# Patient Record
Sex: Female | Born: 1995
Health system: Southern US, Community
[De-identification: ages and names within clinical notes are randomized; demographics above are authoritative.]

## PROBLEM LIST (undated history)

## (undated) DIAGNOSIS — E7211 Homocystinuria: Secondary | ICD-10-CM

## (undated) DIAGNOSIS — F329 Major depressive disorder, single episode, unspecified: Secondary | ICD-10-CM

## (undated) DIAGNOSIS — M5136 Other intervertebral disc degeneration, lumbar region: Secondary | ICD-10-CM

## (undated) DIAGNOSIS — F988 Other specified behavioral and emotional disorders with onset usually occurring in childhood and adolescence: Secondary | ICD-10-CM

## (undated) DIAGNOSIS — F419 Anxiety disorder, unspecified: Secondary | ICD-10-CM

## (undated) DIAGNOSIS — M51369 Other intervertebral disc degeneration, lumbar region without mention of lumbar back pain or lower extremity pain: Secondary | ICD-10-CM

## (undated) DIAGNOSIS — F32A Depression, unspecified: Secondary | ICD-10-CM

## (undated) DIAGNOSIS — H93293 Other abnormal auditory perceptions, bilateral: Secondary | ICD-10-CM

## (undated) DIAGNOSIS — H93299 Other abnormal auditory perceptions, unspecified ear: Secondary | ICD-10-CM

## (undated) HISTORY — PX: OTHER SURGICAL HISTORY: SHX169

## (undated) HISTORY — DX: Depression, unspecified: F32.A

## (undated) HISTORY — DX: Other intervertebral disc degeneration, lumbar region without mention of lumbar back pain or lower extremity pain: M51.369

## (undated) HISTORY — DX: Other abnormal auditory perceptions, bilateral: H93.293

## (undated) HISTORY — DX: Major depressive disorder, single episode, unspecified: F32.9

## (undated) HISTORY — DX: Anxiety disorder, unspecified: F41.9

## (undated) HISTORY — DX: Other intervertebral disc degeneration, lumbar region: M51.36

## (undated) HISTORY — DX: Other abnormal auditory perceptions, unspecified ear: H93.299

## (undated) HISTORY — PX: TYMPANOSTOMY TUBE PLACEMENT: SHX32

---
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
## 2016-01-08 MED FILL — ELFOLATE 15 MG TAB: 15 | 30 days supply | Qty: 30 | Fill #0

## 2016-02-11 DIAGNOSIS — D2239 Melanocytic nevi of other parts of face: Secondary | ICD-10-CM | POA: Diagnosis not present

## 2016-02-11 DIAGNOSIS — L858 Other specified epidermal thickening: Secondary | ICD-10-CM | POA: Diagnosis not present

## 2016-02-11 DIAGNOSIS — R21 Rash and other nonspecific skin eruption: Secondary | ICD-10-CM | POA: Diagnosis not present

## 2016-02-17 DIAGNOSIS — Z01419 Encounter for gynecological examination (general) (routine) without abnormal findings: Secondary | ICD-10-CM | POA: Diagnosis not present

## 2016-02-17 DIAGNOSIS — Z6824 Body mass index (BMI) 24.0-24.9, adult: Secondary | ICD-10-CM | POA: Diagnosis not present

## 2016-02-17 DIAGNOSIS — N760 Acute vaginitis: Secondary | ICD-10-CM | POA: Diagnosis not present

## 2016-02-26 ENCOUNTER — Telehealth: Payer: Self-pay

## 2016-02-26 ENCOUNTER — Encounter: Payer: Self-pay | Admitting: Family

## 2016-02-26 ENCOUNTER — Ambulatory Visit (INDEPENDENT_AMBULATORY_CARE_PROVIDER_SITE_OTHER): Payer: 59 | Admitting: Family

## 2016-02-26 VITALS — BP 110/60 | HR 96 | Temp 99.0°F | Ht 60.02 in | Wt 125.0 lb

## 2016-02-26 DIAGNOSIS — H00013 Hordeolum externum right eye, unspecified eyelid: Secondary | ICD-10-CM

## 2016-02-26 NOTE — Telephone Encounter (Signed)
Please advise 

## 2016-02-26 NOTE — Patient Instructions (Signed)
warm compresses, placed off and on for about 15 minutes at a time approximately four times per day.  If, despite continued treatment with warm compresses, the hordeolum hardens into a chalazion and does not reduce in size within one to two weeks, we will  Refer to an ophthalmologist for consideration of incision and curettage.  If there is no improvement in your symptoms, or if there is any worsening of symptoms, or if you have any additional concerns, please return for re-evaluation; or, if we are closed, consider going to the Emergency Room for evaluation if symptoms urgent.  Stye A stye is a bump on your eyelid caused by a bacterial infection. A stye can form inside the eyelid (internal stye) or outside the eyelid (external stye). An internal stye may be caused by an infected oil-producing gland inside your eyelid. An external stye may be caused by an infection at the base of your eyelash (hair follicle). Styes are very common. Anyone can get them at any age. They usually occur in just one eye, but you may have more than one in either eye.  CAUSES  The infection is almost always caused by bacteria called Staphylococcus aureus. This is a common type of bacteria that lives on your skin. RISK FACTORS You may be at higher risk for a stye if you have had one before. You may also be at higher risk if you have:  Diabetes.  Long-term illness.  Long-term eye redness.  A skin condition called seborrhea.  High fat levels in your blood (lipids). SIGNS AND SYMPTOMS  Eyelid pain is the most common symptom of a stye. Internal styes are more painful than external styes. Other signs and symptoms may include:  Painful swelling of your eyelid.  A scratchy feeling in your eye.  Tearing and redness of your eye.  Pus draining from the stye. DIAGNOSIS  Your health care provider may be able to diagnose a stye just by examining your eye. The health care provider may also check to make sure:  You do not  have a fever or other signs of a more serious infection.  The infection has not spread to other parts of your eye or areas around your eye. TREATMENT  Most styes will clear up in a few days without treatment. In some cases, you may need to use antibiotic drops or ointment to prevent infection. Your health care provider may have to drain the stye surgically if your stye is:  Large.  Causing a lot of pain.  Interfering with your vision. This can be done using a thin blade or a needle.  HOME CARE INSTRUCTIONS   Take medicines only as directed by your health care provider.  Apply a clean, warm compress to your eye for 10 minutes, 4 times a day.  Do not wear contact lenses or eye makeup until your stye has healed.  Do not try to pop or drain the stye. SEEK MEDICAL CARE IF:  You have chills or a fever.  Your stye does not go away after several days.  Your stye affects your vision.  Your eyeball becomes swollen, red, or painful. MAKE SURE YOU:  Understand these instructions.  Will watch your condition.  Will get help right away if you are not doing well or get worse.   This information is not intended to replace advice given to you by your health care provider. Make sure you discuss any questions you have with your health care provider.   Document Released:  05/26/2005 Document Revised: 09/06/2014 Document Reviewed: 11/30/2013 Elsevier Interactive Patient Education [employer].

## 2016-02-26 NOTE — Telephone Encounter (Signed)
Pt is going to [facility] and is requesting a prescription for prednisone and cipro before she leaves. Leaving around 03/13/2016.

## 2016-02-26 NOTE — Progress Notes (Signed)
Pre visit review using our clinic review tool, if applicable. No additional management support is needed unless otherwise documented below in the visit note. 

## 2016-02-26 NOTE — Progress Notes (Signed)
Subjective:    Patient ID: Julie Anderson, female    DOB: March 01, 1996, 20 y.o.   MRN: [id number]   Julie Anderson is a 20 y.o. female who presents today for an acute visit.    HPI Comments: Patient here for evaluation right eyelid swelling which started yesterday. No changes in vision, eye discharge, fever, chills.   Past Medical History  Diagnosis Date  . ADD (attention deficit disorder)   . Anxiety   . Depression    Allergies: Review of patient's allergies indicates no known allergies. Current Outpatient Prescriptions on File Prior to Visit  Medication Sig Dispense Refill  . albuterol (PROVENTIL HFA;VENTOLIN HFA) 108 (90 BASE) MCG/ACT inhaler Inhale 2 puffs into the lungs every 4 (four) hours as needed for wheezing or shortness of breath. 8 g 5  . DAYTRANA 20 MG/9HR Place 1 patch onto the skin daily.   0  . FLUoxetine (PROZAC) 20 MG capsule Take 20 mg by mouth daily.    . fluticasone (FLONASE) 50 MCG/ACT nasal spray Place 2 sprays into both nostrils daily. 16 g 5  . folic acid (FOLVITE) 1 MG tablet Take 1 mg by mouth daily.    . Multiple Vitamins-Minerals (MULTIVITAMIN WITH MINERALS) tablet Take 1 tablet by mouth daily.     No current facility-administered medications on file prior to visit.    Social History  Substance Use Topics  . Smoking status: Never Smoker   . Smokeless tobacco: Never Used  . Alcohol Use: No    Review of Systems  Constitutional: Negative for fever and chills.  Respiratory: Negative for cough.   Cardiovascular: Negative for chest pain and palpitations.  Gastrointestinal: Negative for nausea and vomiting.      Objective:    BP 110/60 mmHg  Pulse 96  Temp(Src) 99 F (37.2 C) (Oral)  Ht 5' 0.02" (1.525 m)  Wt 125 lb (56.7 kg)  BMI 24.38 kg/m2  SpO2 98%   Physical Exam  Constitutional: She appears well-developed and well-nourished.  HENT:  Head: Normocephalic and atraumatic.  Right Ear: Hearing, tympanic membrane, external ear and ear canal  normal. No drainage, swelling or tenderness. No foreign bodies. Tympanic membrane is not erythematous and not bulging. No middle ear effusion. No decreased hearing is noted.  Left Ear: Hearing, tympanic membrane, external ear and ear canal normal. No drainage, swelling or tenderness. No foreign bodies. Tympanic membrane is not erythematous and not bulging.  No middle ear effusion. No decreased hearing is noted.  Nose: Nose normal. No rhinorrhea. Right sinus exhibits no maxillary sinus tenderness and no frontal sinus tenderness. Left sinus exhibits no maxillary sinus tenderness and no frontal sinus tenderness.  Mouth/Throat: Uvula is midline, oropharynx is clear and moist and mucous membranes are normal. No oropharyngeal exudate, posterior oropharyngeal edema, posterior oropharyngeal erythema or tonsillar abscesses.  Eyes: Conjunctivae and EOM are normal. Pupils are equal, round, and reactive to light. Lids are everted and swept, no foreign bodies found. Right eye exhibits hordeolum. Right eye exhibits no discharge and no exudate. No foreign body present in the right eye. Left eye exhibits no discharge, no exudate and no hordeolum. No foreign body present in the left eye. Right conjunctiva is not injected. Right conjunctiva has no hemorrhage. Left conjunctiva is not injected. Left conjunctiva has no hemorrhage. No scleral icterus.  Surrounding skin intact.   Right eye:  Right mid upper lid erythematous, tender nodule. No drainage. Approx 1 cm wide.   No injection of the conjunctiva. No  white spots, opacity, or foreign body appreciated. No collection of blood or pus in the anterior chamber. No ciliary flush surrounding iris.   No photophobia or eye pain appreciated during exam.   Cardiovascular: Regular rhythm, normal heart sounds and normal pulses.   Pulmonary/Chest: Effort normal and breath sounds normal. She has no wheezes. She has no rhonchi. She has no rales.  Lymphadenopathy:       Head (right  side): No submental, no submandibular, no tonsillar, no preauricular, no posterior auricular and no occipital adenopathy present.       Head (left side): No submental, no submandibular, no tonsillar, no preauricular, no posterior auricular and no occipital adenopathy present.    She has no cervical adenopathy.  Neurological: She is alert.  Skin: Skin is warm and dry.  Psychiatric: She has a normal mood and affect. Her speech is normal and behavior is normal. Thought content normal.  Vitals reviewed.      Assessment & Plan:    1. Stye external, right Warm compresses. No evidence of bacterial infection. Advised patient if does not improve in 1 week with warm compresses, I will place a referral to ophthalmology for possible I&D.   I am having Julie Anderson maintain her multivitamin with minerals, folic acid, DAYTRANA, fluticasone, albuterol, and FLUoxetine.   No orders of the defined types were placed in this encounter.     Start medications as prescribed and explained to patient on After Visit Summary ( AVS). Risks, benefits, and alternatives of the medications and treatment plan prescribed today were discussed, and patient expressed understanding.   Education regarding symptom management and diagnosis given to patient.   Follow-up:Plan follow-up and return precautions given if any worsening symptoms or change in condition.   Continue to follow with Binnie Rail, MD for routine health maintenance.   Julie Anderson and I agreed with plan.   Mable Paris, FNP

## 2016-02-27 DIAGNOSIS — L738 Other specified follicular disorders: Secondary | ICD-10-CM | POA: Diagnosis not present

## 2016-02-27 NOTE — Telephone Encounter (Signed)
LVM for pts mother to call back.

## 2016-02-27 NOTE — Telephone Encounter (Signed)
I just saw her sister and mom for the same.  She does not need to come in.  I will give the cipro.  What is the prednisone for?

## 2016-03-01 DIAGNOSIS — F411 Generalized anxiety disorder: Secondary | ICD-10-CM | POA: Diagnosis not present

## 2016-03-04 MED ORDER — CIPROFLOXACIN HCL 500 MG PO TABS: 500.0000 mg | ORAL_TABLET | Freq: Two times a day (BID) | ORAL | Status: DC

## 2016-03-04 MED ORDER — AZITHROMYCIN 250 MG PO TABS: ORAL_TABLET | ORAL | Status: DC

## 2016-03-04 MED FILL — CIPROFLOXACIN HCL 500 MG TA: 500 | 3 days supply | Qty: 6 | Fill #0

## 2016-03-04 MED FILL — AZITHROMYCIN 250 MG TABLET: 250 | 5 days supply | Qty: 6 | Fill #0

## 2016-03-04 NOTE — Telephone Encounter (Signed)
Spoke with pts mother. She states that pt needs Z-pac and Cipro, like given to her sister and mother. Please send to [facility].

## 2016-03-04 NOTE — Telephone Encounter (Signed)
Prescriptions sent to [facility]

## 2016-03-05 MED FILL — FLUoxetine HCL 20 MG CAPS: 20 | 30 days supply | Qty: 30 | Fill #0 | Status: TO

## 2016-03-08 MED FILL — DAYTRANA 20 MG/9 HOUR PATCH: 20 | 30 days supply | Qty: 30 | Fill #0

## 2016-03-08 MED FILL — ELFOLATE 15 MG TAB: 15 | 30 days supply | Qty: 30 | Fill #0 | Status: TO

## 2016-04-02 MED FILL — FLUoxetine HCL 20 MG CAPS: 20 | 30 days supply | Qty: 30 | Fill #0

## 2016-04-02 MED FILL — ELFOLATE 15 MG TAB: 15 | 30 days supply | Qty: 30 | Fill #0 | Status: TO

## 2016-04-05 ENCOUNTER — Telehealth: Payer: Self-pay | Admitting: Internal Medicine

## 2016-04-05 NOTE — Telephone Encounter (Signed)
PLEASE NOTE: All timestamps contained within this report are represented as [place] Standard Time. CONFIDENTIALTY NOTICE: This fax transmission is intended only for the addressee. It contains information that is legally privileged, confidential or otherwise protected from use or disclosure. If you are not the intended recipient, you are strictly prohibited from reviewing, disclosing, copying using or disseminating any of this information or taking any action in reliance on or regarding this information. If you have received this fax in error, please notify us immediately by telephone so that we can arrange for its return to [place]. Phone: [phone], Toll-Free: [phone], Fax: [phone] Page: 1 of 1 Call Id: [id number] [place] Patient Name: Julie Anderson DOB: [birth year] Initial Comment chart 2/3 Caller says, family came back from [place] last week, father Dx with E-coli infection and in hospital. Mother wants to get dtr tested for this as well. No Sx now. Nurse Assessment Nurse: Dimas Chyle, RN, Dellis Filbert Date/Time Eilene Ghazi Time): 04/05/2016 9:34:56 AM Confirm and document reason for call. If symptomatic, describe symptoms. You must click the next button to save text entered. ---Caller says, family came back from [place] last week, father Dx with E-coli infection and in hospital. Mother wants to get dtr tested for this as well. No Sx now. Has the patient traveled out of the country within the last 30 days? ---Yes Where have you traveled? ([place] for [place] and Ebola guideline, [place], [place] for [place]) ---[place] Does the patient have any new or worsening symptoms? ---No Please document clinical information provided and list any resource used. ---Advised caller that I could schedule appointment for daughter for blood work due to possible exposure to E-coli, Guidelines Guideline Title  Affirmed Question Affirmed Notes Final Disposition User Clinical Call Pierz, Therapist, sports, [employer]

## 2016-04-07 ENCOUNTER — Ambulatory Visit: Payer: Self-pay | Admitting: Internal Medicine

## 2016-05-05 DIAGNOSIS — F411 Generalized anxiety disorder: Secondary | ICD-10-CM | POA: Diagnosis not present

## 2016-05-07 MED FILL — TRINTELLIX 10 MG TABLET: 10 | 30 days supply | Qty: 30 | Fill #0 | Status: TO

## 2016-05-31 ENCOUNTER — Ambulatory Visit (INDEPENDENT_AMBULATORY_CARE_PROVIDER_SITE_OTHER): Payer: 59

## 2016-05-31 DIAGNOSIS — Z23 Encounter for immunization: Secondary | ICD-10-CM

## 2016-05-31 MED FILL — ELFOLATE 15 MG TAB: 15 | 30 days supply | Qty: 30 | Fill #0

## 2016-05-31 MED FILL — TRINTELLIX 10 MG TABLET: 10 | 30 days supply | Qty: 30 | Fill #0

## 2016-06-02 DIAGNOSIS — Z5181 Encounter for therapeutic drug level monitoring: Secondary | ICD-10-CM | POA: Diagnosis not present

## 2016-06-02 DIAGNOSIS — D559 Anemia due to enzyme disorder, unspecified: Secondary | ICD-10-CM | POA: Diagnosis not present

## 2016-06-16 DIAGNOSIS — F411 Generalized anxiety disorder: Secondary | ICD-10-CM | POA: Diagnosis not present

## 2016-06-16 MED FILL — VIT D2 1.25 MG (50,000 UNIT: 1.25 MG | 28 days supply | Qty: 4 | Fill #0

## 2016-06-29 DIAGNOSIS — H93299 Other abnormal auditory perceptions, unspecified ear: Secondary | ICD-10-CM | POA: Diagnosis not present

## 2016-07-03 LAB — LIPID PANEL
Cholesterol: 270 mg/dL — AB (ref 0–200)
HDL: 52 mg/dL (ref 35–70)
LDL Cholesterol: 173 mg/dL
Triglycerides: 223 mg/dL — AB (ref 40–160)

## 2016-07-03 LAB — BASIC METABOLIC PANEL: Glucose: 116 mg/dL

## 2016-07-05 ENCOUNTER — Ambulatory Visit (INDEPENDENT_AMBULATORY_CARE_PROVIDER_SITE_OTHER): Payer: 59 | Admitting: Internal Medicine

## 2016-07-05 ENCOUNTER — Encounter: Payer: Self-pay | Admitting: Internal Medicine

## 2016-07-05 DIAGNOSIS — F32A Depression, unspecified: Secondary | ICD-10-CM

## 2016-07-05 DIAGNOSIS — R739 Hyperglycemia, unspecified: Secondary | ICD-10-CM | POA: Diagnosis not present

## 2016-07-05 DIAGNOSIS — E785 Hyperlipidemia, unspecified: Secondary | ICD-10-CM | POA: Insufficient documentation

## 2016-07-05 DIAGNOSIS — E559 Vitamin D deficiency, unspecified: Secondary | ICD-10-CM | POA: Diagnosis not present

## 2016-07-05 DIAGNOSIS — F329 Major depressive disorder, single episode, unspecified: Secondary | ICD-10-CM

## 2016-07-05 DIAGNOSIS — E7211 Homocystinuria: Secondary | ICD-10-CM

## 2016-07-05 DIAGNOSIS — E7800 Pure hypercholesterolemia, unspecified: Secondary | ICD-10-CM

## 2016-07-05 DIAGNOSIS — F418 Other specified anxiety disorders: Secondary | ICD-10-CM

## 2016-07-05 DIAGNOSIS — F419 Anxiety disorder, unspecified: Secondary | ICD-10-CM

## 2016-07-05 DIAGNOSIS — F988 Other specified behavioral and emotional disorders with onset usually occurring in childhood and adolescence: Secondary | ICD-10-CM

## 2016-07-05 DIAGNOSIS — H93239 Hyperacusis, unspecified ear: Secondary | ICD-10-CM | POA: Insufficient documentation

## 2016-07-05 MED ORDER — VORTIOXETINE HBR 10 MG PO TABS: ORAL_TABLET | ORAL | Status: DC

## 2016-07-05 NOTE — Progress Notes (Signed)
Subjective:    Patient ID: Julie Anderson, female    DOB: 02-18-1996, 20 y.o.   MRN: [id number]  HPI She is here for follow up.  She recently blood work done by her psychiatrist and is here for follow-up regarding some abnormal tests.  Vitamin D deficiency: Her vitamin D level is low. She is currently taking a high-dose vitamin D. She was told to take a daily vitamin D following high-dose, but is unsure how much.   Hyperglycemia:  She does her blood work fasting in her sugar was elevated. She is currently in college and admits that she does not eat healthy as diet. She has recently started to exercise, but prior to that is not exercising regularly. She has been swimming.  She drinks Gatorade, soda (moutain dew or dr pepper) - three times a week or water.  She does not eat breakfast.  She eats at the [employer]-- eats pasta for lunch.  Dinner is usually - [employer], Technical sales engineer, Retail banker.  Snacks: peach yogurt, popcorn, bagel bits.  No coffee or alcohol.    She started to swim laps 2/weeks.  She just started this two weeks ago.  No exercise prior to that.    She also was noted to have high cholesterol. She is adopted and her family history is unknown.  ADHD, anxiety, depression: She has been following with psychiatrist, but the psychiatrist retired. They're hoping that I will be able to prescribe her medications, which she has been stable. She denies any side effects.  Medications and allergies reviewed with patient and updated if appropriate.  Patient Active Problem List   Diagnosis Date Noted  . Lumbar degenerative disc disease 06/25/2015  . Myofascial pain syndrome 06/06/2015  . ADD (attention deficit disorder) 09/24/2013  . Anxiety and depression 07/12/2013  . History of ADHD 07/12/2013  . Auditory hallucination 07/12/2013  . Allergic rhinitis 07/12/2013    Current Outpatient Prescriptions on File Prior to Visit  Medication Sig Dispense Refill  . albuterol (PROVENTIL  HFA;VENTOLIN HFA) 108 (90 BASE) MCG/ACT inhaler Inhale 2 puffs into the lungs every 4 (four) hours as needed for wheezing or shortness of breath. 8 g 5  . DAYTRANA 20 MG/9HR Place 1 patch onto the skin daily.   0  . FLUoxetine (PROZAC) 20 MG capsule Take 20 mg by mouth daily.    . fluticasone (FLONASE) 50 MCG/ACT nasal spray Place 2 sprays into both nostrils daily. 16 g 5  . folic acid (FOLVITE) 1 MG tablet Take 1 mg by mouth daily.    . Multiple Vitamins-Minerals (MULTIVITAMIN WITH MINERALS) tablet Take 1 tablet by mouth daily.     No current facility-administered medications on file prior to visit.     Past Medical History:  Diagnosis Date  . ADD (attention deficit disorder)   . Anxiety   . Depression     Past Surgical History:  Procedure Laterality Date  . TYMPANOSTOMY TUBE PLACEMENT      Social History   Social History  . Marital status: Single    Spouse name: N/A  . Number of children: N/A  . Years of education: N/A   Social History Main Topics  . Smoking status: Never Smoker  . Smokeless tobacco: Never Used  . Alcohol use No  . Drug use: No  . Sexual activity: Not on file   Other Topics Concern  . 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 Adopted: Yes    Review of Systems  Constitutional: Negative for appetite change, fever and unexpected weight change.  Respiratory: Negative for cough, shortness of breath and wheezing.   Cardiovascular: Negative for chest pain, palpitations and leg swelling.  Musculoskeletal: Negative for arthralgias and myalgias.  Neurological: Negative for dizziness, light-headedness and headaches.       Objective:   Vitals:   07/05/16 1438  BP: 106/74  Pulse: 97  Resp: 16  Temp: 99.1 F (37.3 C)   Filed Weights   07/05/16 1438  Weight: 130 lb (59 kg)   Body mass index is 25.39 kg/m.   Physical Exam Constitutional: Appears  well-developed and well-nourished. No distress.  HENT:  Head: Normocephalic and atraumatic.  Neck: Neck supple. No tracheal deviation present. No thyromegaly present.  No cervical lymphadenopathy Cardiovascular: Normal rate, regular rhythm and normal heart sounds.   No murmur heard. No carotid bruit .  No edema Pulmonary/Chest: Effort normal and breath sounds normal. No respiratory distress. No has no wheezes. No rales.  Skin: Skin is warm and dry. Not diaphoretic.  Psychiatric: Normal mood and affect. Behavior is normal.        Assessment & Plan:   See Problem List for Assessment and Plan of chronic medical problems.

## 2016-07-05 NOTE — Progress Notes (Signed)
Pre visit review using our clinic review tool, if applicable. No additional management support is needed unless otherwise documented below in the visit note. 

## 2016-07-05 NOTE — Patient Instructions (Signed)
  Test(s) ordered today. Your results will be released to [place] (or called to you) after review, usually within 72hours after test completion. If any changes need to be made, you will be notified at that same time.    Medications reviewed and updated.  No changes recommended at this time.   Please followup depending on blood work results

## 2016-07-07 MED FILL — TRINTELLIX 20 MG TABLET: 20 | 30 days supply | Qty: 30 | Fill #0

## 2016-07-08 ENCOUNTER — Encounter: Payer: Self-pay | Admitting: Internal Medicine

## 2016-07-08 NOTE — Assessment & Plan Note (Signed)
Recent fasting glucose elevated Discussed healthy diet, decreased portions, regular exercise and weight loss Discussed risk of prediabetes/diabetes, which may partially be genetic Recheck CMP, A1c in 2-3 months

## 2016-07-08 NOTE — Assessment & Plan Note (Signed)
I will start prescribing her medication Stable, controlled Continue current medication

## 2016-07-08 NOTE — Assessment & Plan Note (Signed)
Currently taking high-dose vitamin D Advised her to take 1000-2000 units daily after that Check vitamin D level. In 2-3 months

## 2016-07-08 NOTE — Assessment & Plan Note (Signed)
Currently taking full of gas daily Will recheck homocysteine level

## 2016-07-08 NOTE — Assessment & Plan Note (Signed)
Stable, controlled I will start prescribing her medication Continue Daytrana

## 2016-07-08 NOTE — Assessment & Plan Note (Signed)
Discussed healthy diet, decreased portions, regular exercise and weight loss We will recheck lipid panel in 2- 3 months

## 2016-07-12 DIAGNOSIS — H93233 Hyperacusis, bilateral: Secondary | ICD-10-CM | POA: Diagnosis not present

## 2016-07-12 DIAGNOSIS — H93299 Other abnormal auditory perceptions, unspecified ear: Secondary | ICD-10-CM | POA: Diagnosis not present

## 2016-08-12 ENCOUNTER — Ambulatory Visit (INDEPENDENT_AMBULATORY_CARE_PROVIDER_SITE_OTHER): Payer: 59 | Admitting: Psychology

## 2016-08-12 DIAGNOSIS — F4323 Adjustment disorder with mixed anxiety and depressed mood: Secondary | ICD-10-CM

## 2016-09-06 MED FILL — ELFOLATE 15 MG TAB: 15 | 30 days supply | Qty: 30 | Fill #0

## 2016-09-10 ENCOUNTER — Other Ambulatory Visit (INDEPENDENT_AMBULATORY_CARE_PROVIDER_SITE_OTHER): Payer: 59

## 2016-09-10 DIAGNOSIS — E7800 Pure hypercholesterolemia, unspecified: Secondary | ICD-10-CM | POA: Diagnosis not present

## 2016-09-10 DIAGNOSIS — E7211 Homocystinuria: Secondary | ICD-10-CM | POA: Diagnosis not present

## 2016-09-10 DIAGNOSIS — E559 Vitamin D deficiency, unspecified: Secondary | ICD-10-CM | POA: Diagnosis not present

## 2016-09-10 DIAGNOSIS — R739 Hyperglycemia, unspecified: Secondary | ICD-10-CM

## 2016-09-10 LAB — COMPREHENSIVE METABOLIC PANEL
ALBUMIN: 4.4 g/dL (ref 3.5–5.2)
ALK PHOS: 85 U/L (ref 47–119)
ALT: 15 U/L (ref 0–35)
AST: 17 U/L (ref 0–37)
BUN: 17 mg/dL (ref 6–23)
CALCIUM: 9.6 mg/dL (ref 8.4–10.5)
CHLORIDE: 107 meq/L (ref 96–112)
CO2: 25 mEq/L (ref 19–32)
CREATININE: 0.76 mg/dL (ref 0.40–1.20)
GFR: 103.59 mL/min (ref >60.00)
Glucose, Bld: 106 mg/dL — ABNORMAL HIGH (ref 70–99)
POTASSIUM: 3.8 meq/L (ref 3.5–5.1)
Sodium: 140 mEq/L (ref 135–145)
Total Bilirubin: 0.4 mg/dL (ref 0.2–1.2)
Total Protein: 7.5 g/dL (ref 6.0–8.3)

## 2016-09-10 LAB — LIPID PANEL
Cholesterol: 276 mg/dL — ABNORMAL HIGH (ref 0–200)
HDL: 60.5 mg/dL (ref >39.00)
LDL CALC: 197 mg/dL — AB (ref 0–99)
NonHDL: 215.58
TRIGLYCERIDES: 93 mg/dL (ref 0.0–149.0)
Total CHOL/HDL Ratio: 5
VLDL: 18.6 mg/dL (ref 0.0–40.0)

## 2016-09-10 LAB — VITAMIN D 25 HYDROXY (VIT D DEFICIENCY, FRACTURES): VITD: 42.19 ng/mL (ref 30.00–100.00)

## 2016-09-10 LAB — HEMOGLOBIN A1C: Hgb A1c MFr Bld: 5.5 % (ref 4.6–6.5)

## 2016-09-14 ENCOUNTER — Encounter: Payer: Self-pay | Admitting: Internal Medicine

## 2016-09-14 ENCOUNTER — Ambulatory Visit (INDEPENDENT_AMBULATORY_CARE_PROVIDER_SITE_OTHER): Payer: 59 | Admitting: Internal Medicine

## 2016-09-14 VITALS — BP 112/72 | HR 93 | Temp 98.3°F | Resp 16 | Wt 134.0 lb

## 2016-09-14 DIAGNOSIS — E559 Vitamin D deficiency, unspecified: Secondary | ICD-10-CM

## 2016-09-14 DIAGNOSIS — F419 Anxiety disorder, unspecified: Secondary | ICD-10-CM

## 2016-09-14 DIAGNOSIS — R739 Hyperglycemia, unspecified: Secondary | ICD-10-CM | POA: Diagnosis not present

## 2016-09-14 DIAGNOSIS — R05 Cough: Secondary | ICD-10-CM

## 2016-09-14 DIAGNOSIS — E7211 Homocystinuria: Secondary | ICD-10-CM

## 2016-09-14 DIAGNOSIS — E7800 Pure hypercholesterolemia, unspecified: Secondary | ICD-10-CM

## 2016-09-14 DIAGNOSIS — F329 Major depressive disorder, single episode, unspecified: Secondary | ICD-10-CM

## 2016-09-14 DIAGNOSIS — F418 Other specified anxiety disorders: Secondary | ICD-10-CM

## 2016-09-14 DIAGNOSIS — R059 Cough, unspecified: Secondary | ICD-10-CM

## 2016-09-14 LAB — POCT EXHALED NITRIC OXIDE: FENO LEVEL (PPB): 66

## 2016-09-14 MED ORDER — DULOXETINE HCL 30 MG PO CPEP: 30.0000 mg | ORAL_CAPSULE | Freq: Every day | ORAL | 1 refills | Status: DC

## 2016-09-14 MED FILL — DULoxetine HCL 30 MG CPEP: 30 | 90 days supply | Qty: 90 | Fill #0

## 2016-09-14 NOTE — Progress Notes (Signed)
Pre visit review using our clinic review tool, if applicable. No additional management support is needed unless otherwise documented below in the visit note. 

## 2016-09-14 NOTE — Assessment & Plan Note (Signed)
Stopped trintellix due to nausea Discussed options  Will start cymbalta 30 mg daily - can titrate up if needed Follow up in 2 months sooner if needed

## 2016-09-14 NOTE — Patient Instructions (Addendum)
    Medications reviewed and updated.  Changes include starting cymbalta 30 mg daily.   Your prescription(s) have been submitted to your pharmacy. Please take as directed and contact our office if you believe you are having problem(s) with the medication(s).  A referral was ordered for cardiology  Please followup in 2 months

## 2016-09-14 NOTE — Assessment & Plan Note (Signed)
Likely genetic Has improved diet, but will continue to work on improving it Has not started exercising Work on weight loss ? Should we start a statin - has other risk factors Will refer to cardiology for their opinion

## 2016-09-14 NOTE — Assessment & Plan Note (Signed)
Dry, chronic No gerd, occ PND, takes allegra daily Has used an inhaler in the past and it has helped No obvious triggers FENO today is 54 - likely asthma Will refer to pulm

## 2016-09-14 NOTE — Assessment & Plan Note (Signed)
D level better Continue 5000 units daily Will recheck D level in several months

## 2016-09-14 NOTE — Assessment & Plan Note (Signed)
Lab Results  Component Value Date   HGBA1C 5.5 09/10/2016   Continue low sugar/carb diet Start regular exercise Work on weight loss

## 2016-09-14 NOTE — Progress Notes (Signed)
Subjective:    Patient ID: Julie Anderson, female    DOB: 1995-11-17, 21 y.o.   MRN: [id number]  HPI The patient is here for follow up. Her mother is with her.   Hyperglycemia:  Her a1c is in the normal range.  She has cut down on sugars/carbs.  She is not currently exercising, but will start soon.    Hyperlipidemia:  She is only drinking water. She rarely drinks soda. She plans on starting to exercise.  She has been eating healthy.  When she eats at school she has difficulty eating healthy.     Vitamin d deficiency:  Her most recent vitamin d level is normal.  She is taking her vitamin D daily.    Depression, anxiety: She stopped trintellix because it was causing nausea.  she does not feel depressed or anxious.    She has social anxiety, generalized anxiety, and depressed. She and her mom wondered about trying cymbalta or celexa.   Dry cough:  She has a chronic dry cough for years.  Starts coughing when first gets up in the morning.  She denies GERD and regular PND, but has PND on occasion.  She takes Human resources officer daily.  She has an inhaler and feels it does help.  She denies any SOB or wheeze.      Medications and allergies reviewed with patient and updated if appropriate.  Patient Active Problem List   Diagnosis Date Noted  . Hyperlipidemia 07/05/2016  . Vitamin D deficiency 07/05/2016  . Hyperglycemia 07/05/2016  . Hyperhomocysteinemia ([place]) 07/05/2016  . Hyperacusis 07/05/2016  . Lumbar degenerative disc disease 06/25/2015  . Myofascial pain syndrome 06/06/2015  . ADD (attention deficit disorder) 09/24/2013  . Anxiety and depression 07/12/2013  . History of ADHD 07/12/2013  . Auditory hallucination 07/12/2013  . Allergic rhinitis 07/12/2013    Current Outpatient Prescriptions on File Prior to Visit  Medication Sig Dispense Refill  . albuterol (PROVENTIL HFA;VENTOLIN HFA) 108 (90 BASE) MCG/ACT inhaler Inhale 2 puffs into the lungs every 4 (four) hours as needed for wheezing or  shortness of breath. 8 g 5  . DAYTRANA 20 MG/9HR Place 1 patch onto the skin daily.   0  . fluticasone (FLONASE) 50 MCG/ACT nasal spray Place 2 sprays into both nostrils daily. 16 g 5  . folic acid (FOLVITE) 1 MG tablet Take 1 mg by mouth daily.    . Multiple Vitamins-Minerals (MULTIVITAMIN WITH MINERALS) tablet Take 1 tablet by mouth daily.     No current facility-administered medications on file prior to visit.     Past Medical History:  Diagnosis Date  . ADD (attention deficit disorder)   . Anxiety   . Depression     Past Surgical History:  Procedure Laterality Date  . TYMPANOSTOMY TUBE PLACEMENT      Social History   Social History  . Marital status: Single    Spouse name: N/A  . Number of children: N/A  . Years of education: N/A   Social History Main Topics  . Smoking status: Never Smoker  . Smokeless tobacco: Never Used  . Alcohol use No  . Drug use: No  . Sexual activity: Not on file   Other Topics Concern  . 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 Adopted: Yes    Review of Systems  Constitutional:  Negative for fever.  HENT: Positive for congestion, postnasal drip and rhinorrhea.        Seasonal allergy symptoms only  Respiratory: Positive for cough (chronic, daily, dry). Negative for shortness of breath and wheezing.   Cardiovascular: Negative for chest pain, palpitations and leg swelling.  Gastrointestinal:       No gerd  Neurological: Negative for weakness, light-headedness, numbness and headaches.       Objective:   Vitals:   09/14/16 1537  BP: 112/72  Pulse: 93  Resp: 16  Temp: 98.3 F (36.8 C)   Wt Readings from Last 3 Encounters:  09/14/16 134 lb (60.8 kg) (61 %, Z= 0.28)*  07/05/16 130 lb (59 kg) (55 %, Z= 0.13)*  02/26/16 125 lb (56.7 kg) (47 %, Z= -0.07)*   * Growth percentiles are based on CDC 2-20 Years data.   Body mass index is  26.17 kg/m.   Physical Exam    Constitutional: Appears well-developed and well-nourished. No distress.  HENT:  Head: Normocephalic and atraumatic.  Neck: Neck supple. No tracheal deviation present. No thyromegaly present.  No cervical lymphadenopathy Cardiovascular: Normal rate, regular rhythm and normal heart sounds.   No murmur heard. No carotid bruit .  No edema Pulmonary/Chest: Effort normal and breath sounds normal. No respiratory distress. No has no wheezes. No rales.  Skin: Skin is warm and dry. Not diaphoretic.  Psychiatric: Normal mood and affect. Behavior is normal.      Assessment & Plan:    See Problem List for Assessment and Plan of chronic medical problems.   FU in 2 months

## 2016-09-15 ENCOUNTER — Encounter: Payer: Self-pay | Admitting: Internal Medicine

## 2016-09-20 MED ORDER — ESCITALOPRAM OXALATE 10 MG PO TABS: ORAL_TABLET | ORAL | 5 refills | Status: DC

## 2016-09-20 NOTE — Addendum Note (Signed)
Addended by: Binnie Rail on: 09/20/2016 10:09 PM   Modules accepted: Orders

## 2016-09-20 NOTE — Telephone Encounter (Signed)
Please advise what dose.

## 2016-09-28 ENCOUNTER — Ambulatory Visit: Payer: Self-pay | Admitting: Pulmonary Disease

## 2016-09-30 ENCOUNTER — Ambulatory Visit (INDEPENDENT_AMBULATORY_CARE_PROVIDER_SITE_OTHER): Payer: 59 | Admitting: Psychology

## 2016-09-30 DIAGNOSIS — F4323 Adjustment disorder with mixed anxiety and depressed mood: Secondary | ICD-10-CM | POA: Diagnosis not present

## 2016-10-08 MED FILL — ESCITALOPRAM 10 MG TABLET: 10 | 30 days supply | Qty: 30 | Fill #0

## 2016-10-14 ENCOUNTER — Ambulatory Visit (INDEPENDENT_AMBULATORY_CARE_PROVIDER_SITE_OTHER): Payer: 59 | Admitting: Psychology

## 2016-10-14 DIAGNOSIS — F4323 Adjustment disorder with mixed anxiety and depressed mood: Secondary | ICD-10-CM | POA: Diagnosis not present

## 2016-10-21 ENCOUNTER — Encounter: Payer: Self-pay | Admitting: Cardiology

## 2016-10-28 ENCOUNTER — Ambulatory Visit (INDEPENDENT_AMBULATORY_CARE_PROVIDER_SITE_OTHER): Payer: 59 | Admitting: Psychology

## 2016-10-28 DIAGNOSIS — F4323 Adjustment disorder with mixed anxiety and depressed mood: Secondary | ICD-10-CM | POA: Diagnosis not present

## 2016-11-01 ENCOUNTER — Ambulatory Visit: Payer: 59 | Admitting: Cardiology

## 2016-11-09 ENCOUNTER — Other Ambulatory Visit (INDEPENDENT_AMBULATORY_CARE_PROVIDER_SITE_OTHER): Payer: 59

## 2016-11-09 ENCOUNTER — Encounter: Payer: Self-pay | Admitting: Pulmonary Disease

## 2016-11-09 ENCOUNTER — Ambulatory Visit (INDEPENDENT_AMBULATORY_CARE_PROVIDER_SITE_OTHER): Payer: 59 | Admitting: Pulmonary Disease

## 2016-11-09 VITALS — BP 116/70 | HR 109 | Ht 59.5 in | Wt 131.0 lb

## 2016-11-09 DIAGNOSIS — R000 Tachycardia, unspecified: Secondary | ICD-10-CM

## 2016-11-09 DIAGNOSIS — R05 Cough: Secondary | ICD-10-CM

## 2016-11-09 DIAGNOSIS — R059 Cough, unspecified: Secondary | ICD-10-CM

## 2016-11-09 LAB — CBC WITH DIFFERENTIAL/PLATELET
BASOS ABS: 0 10*3/uL (ref 0.0–0.1)
Basophils Relative: 0.2 % (ref 0.0–3.0)
EOS ABS: 0.3 10*3/uL (ref 0.0–0.7)
Eosinophils Relative: 4.7 % (ref 0.0–5.0)
HEMATOCRIT: 40.9 % (ref 36.0–49.0)
Hemoglobin: 13.8 g/dL (ref 12.0–16.0)
LYMPHS PCT: 32.8 % (ref 24.0–48.0)
Lymphs Abs: 2.4 10*3/uL (ref 0.7–4.0)
MCHC: 33.8 g/dL (ref 31.0–37.0)
MCV: 89.6 fl (ref 78.0–98.0)
MONOS PCT: 9.5 % (ref 3.0–12.0)
Monocytes Absolute: 0.7 10*3/uL (ref 0.1–1.0)
Neutro Abs: 3.8 10*3/uL (ref 1.4–7.7)
Neutrophils Relative %: 52.8 % (ref 43.0–71.0)
Platelets: 344 10*3/uL (ref 150.0–575.0)
RBC: 4.57 Mil/uL (ref 3.80–5.70)
RDW: 12.8 % (ref 11.4–15.5)
WBC: 7.2 10*3/uL (ref 4.5–13.5)

## 2016-11-09 MED ORDER — BUDESONIDE-FORMOTEROL FUMARATE 160-4.5 MCG/ACT IN AERO: 2.0000 | INHALATION_SPRAY | Freq: Two times a day (BID) | RESPIRATORY_TRACT | 5 refills | Status: DC

## 2016-11-09 MED ORDER — ALBUTEROL SULFATE HFA 108 (90 BASE) MCG/ACT IN AERS: 2.0000 | INHALATION_SPRAY | Freq: Four times a day (QID) | RESPIRATORY_TRACT | 2 refills | Status: DC | PRN

## 2016-11-09 MED ORDER — BUDESONIDE-FORMOTEROL FUMARATE 160-4.5 MCG/ACT IN AERO: 2.0000 | INHALATION_SPRAY | Freq: Two times a day (BID) | RESPIRATORY_TRACT | 0 refills | Status: DC

## 2016-11-09 MED FILL — ESCITALOPRAM 10 MG TABLET: 10 | 30 days supply | Qty: 30 | Fill #1

## 2016-11-09 MED FILL — SYMBICORT 160-4.5 MCG INH: 160-4.5 | 30 days supply | Qty: 10 | Fill #0

## 2016-11-09 MED FILL — VENTOLIN HFA 90 MCG INHALER: 108 (90 BAS | 25 days supply | Qty: 18 | Fill #0

## 2016-11-09 NOTE — Progress Notes (Signed)
Julie Anderson    [id number]    1995/12/30  Primary Care Physician:Stacy Lorretta Harp, MD  Referring Physician: Binnie Rail, MD [place], [place] [address]  Chief complaint:   Follow up for Chronic cough Allergic rhinitis  Moderate persistent asthma  HPI: Julie Anderson is here for follow up of chronic cough. She's had this problem for the past 3 years. It's usually nonproductive in nature. It is not associated with dyspnea, wheezing, fevers, chills, hemoptysis. She has some chest pain when she coughs. She has seasonal allergies in spring and fall with occasional rhinitis, postnasal drip. She is allergic to cats, dogs, strong perfumes. She has a diagnosis of GERD in the charts but as per her mother this was wrong diagnosis. Her symptoms were attributable to anxiety. She denies any GERD symptoms, heartburn.  Interim History: Since her last visit she reports worsening cough, dyspnea on exertion associated with wheezing. She had an FENO at her primary care office which was elevated. She was daily respiratory symptoms. She is using her albuterol up to 2 times a day.  She is currently at [facility]. She does not report any mold issues or exposures in college or in her dormitory.  Outpatient Encounter Prescriptions as of 11/09/2016  Medication Sig  . albuterol (PROVENTIL HFA;VENTOLIN HFA) 108 (90 BASE) MCG/ACT inhaler Inhale 2 puffs into the lungs every 4 (four) hours as needed for wheezing or shortness of breath.  . Cholecalciferol (VITAMIN D-3) 5000 units TABS Take by mouth daily.  Marland Kitchen DAYTRANA 20 MG/9HR Place 1 patch onto the skin daily.   Marland Kitchen escitalopram (LEXAPRO) 10 MG tablet Take 5 mg daily for 1-2 weeks then increase to 10 mg daily  . fluticasone (FLONASE) 50 MCG/ACT nasal spray Place 2 sprays into both nostrils daily.  . folic acid (FOLVITE) 1 MG tablet Take 1 mg by mouth daily.  Marland Kitchen L-Methylfolate (ELFOLATE) 15 MG TABS   . loratadine (CLARITIN) 10 MG tablet Take 10 mg by mouth  daily.  . Multiple Vitamins-Minerals (MULTIVITAMIN WITH MINERALS) tablet Take 1 tablet by mouth daily.  . [DISCONTINUED] Fexofenadine HCl (ALLEGRA PO) Take by mouth daily.   No facility-administered encounter medications on file as of 11/09/2016.     Allergies as of 11/09/2016 - Review Complete 11/09/2016  Allergen Reaction Noted  . Cymbalta [duloxetine hcl]  09/17/2016    Past Medical History:  Diagnosis Date  . ADD (attention deficit disorder)   . Anxiety   . Depression     Past Surgical History:  Procedure Laterality Date  . TYMPANOSTOMY TUBE PLACEMENT      Family History  Problem Relation Age of Onset  . Adopted: Yes    Social History   Social History  . Marital status: Single    Spouse name: N/A  . Number of children: N/A  . Years of education: N/A   Occupational History  . Not on file.   Social History Main Topics  . Smoking status: Never Smoker  . Smokeless tobacco: Never Used  . Alcohol use No  . Drug use: No  . Sexual activity: Not on file   Other Topics Concern  . 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  HENT: Negative.   Eyes: Negative for blurred vision.  Respiratory: as per HPI  Cardiovascular: Negative for chest pain and palpitations.  Gastrointestinal: Negative for vomiting, diarrhea, blood per rectum. Genitourinary: Negative for dysuria, urgency, frequency and hematuria.  Musculoskeletal: Negative for myalgias, back pain and joint pain.  Skin: Negative for itching and rash.  Neurological: Negative for dizziness, tremors, focal weakness, seizures and loss of consciousness.  Endo/Heme/Allergies: Negative for environmental allergies.  Psychiatric/Behavioral: Negative for depression, suicidal ideas and hallucinations.  All other systems reviewed and are negative.  Physical Exam: Blood pressure 116/70, pulse  (!) 109, height 4' 11.5" (1.511 m), weight 131 lb (59.4 kg), SpO2 98 %. Gen:      No acute distress HEENT:  EOMI, sclera anicteric Neck:     No masses; no thyromegaly Lungs:    Clear to auscultation bilaterally; normal respiratory effort CV:         Regular rate and rhythm; no murmurs Abd:      + bowel sounds; soft, non-tender; no palpable masses, no distension Ext:    No edema; adequate peripheral perfusion Skin:      Warm and dry; no rash Neuro: alert and oriented x 3 Psych: normal mood and affect  Data Reviewed: FENO 09/14/16- 66  CXR (06/22/10) No active lung disease. I have reviewed the images personally   PFTs [08/19/15] FVC 2.68 [81%] FEV1 2.23 [75%) F/F 83 FEF 25-75 percent 2.59 [72%] TLC 4.42 [81%] RV/TLC 152% DLCO 107% Minimal small airway disease suggested by reduction in mid flow rates. Airtrapping suggested by elevated RV/TLC ratio.  Assessment:  #1 Asthma, moderate perisistent. Elevated FENO with sensitivity to cats, dogs, strong perfumes. She does have minimal small airway obstruction on PFTs and air trapping but no bronchodilator response. We will evaluate with CBC with diff, blood allergy profile. Start symbicort. Return in 1-2 months for repeat FENO and reval of reymptoms   #2 Cough Chronic cough may be related to her rhinitis and postnasal drip. She does not have any symptoms of GERD. She'll continue on Flonase nasal spray and claritin  #3 Tachycardia secondary to Anxiety Noted to have mild tachycardia in office. EKG done and reviewed which does not show any acute abnormality.  Plan/Recommendations: - Check CBC with diff, blood allergy profile - Start symbicort 160/4.5  Marshell Garfinkel MD [facility] Pager [phone] 11/09/2016, 3:38 PM  CC: Binnie Rail, MD

## 2016-11-09 NOTE — Patient Instructions (Signed)
We will start you on Symbicort 160/4.5. We'll send in a prescription for albuterol rescue inhaler Will check blood test for CBC differential and a blood allergy profile  Return to clinic in 1-2 months.

## 2016-11-10 LAB — RESPIRATORY ALLERGY PROFILE REGION II ~~LOC~~
ALLERGEN, COMM SILVER BIRCH, T3: 0.67 kU/L — AB
Allergen, C. Herbarum, M2: <0.1 kU/L
Allergen, Cottonwood, t14: <0.1 kU/L
Allergen, D pternoyssinus,d7: 16.2 kU/L — ABNORMAL HIGH
Allergen, Mulberry, t76: 0.13 kU/L — ABNORMAL HIGH
Allergen, Oak,t7: 0.21 kU/L — ABNORMAL HIGH
Bermuda Grass: <0.1 kU/L
COMMON RAGWEED: 0.26 kU/L — AB
Cat Dander: 1.09 kU/L — ABNORMAL HIGH
Cockroach: <0.1 kU/L
D. farinae: 17.5 kU/L — ABNORMAL HIGH
Dog Dander: 1.05 kU/L — ABNORMAL HIGH
Elm IgE: 0.24 kU/L — ABNORMAL HIGH
IgE (Immunoglobulin E), Serum: 79 kU/L (ref <115)
Johnson Grass: <0.1 kU/L
Pecan/Hickory Tree IgE: 2.23 kU/L — ABNORMAL HIGH
Rough Pigweed  IgE: <0.1 kU/L
Sheep Sorrel IgE: <0.1 kU/L

## 2016-11-11 ENCOUNTER — Ambulatory Visit (INDEPENDENT_AMBULATORY_CARE_PROVIDER_SITE_OTHER): Payer: 59 | Admitting: Psychology

## 2016-11-11 DIAGNOSIS — F4323 Adjustment disorder with mixed anxiety and depressed mood: Secondary | ICD-10-CM | POA: Diagnosis not present

## 2016-11-23 ENCOUNTER — Ambulatory Visit: Payer: 59 | Admitting: Cardiology

## 2016-11-25 ENCOUNTER — Ambulatory Visit: Payer: Self-pay | Admitting: Psychology

## 2016-12-09 ENCOUNTER — Ambulatory Visit: Payer: Self-pay | Admitting: Psychology

## 2016-12-14 MED FILL — SYMBICORT 160-4.5 MCG INH: 160-4.5 | 30 days supply | Qty: 10 | Fill #1

## 2016-12-14 MED FILL — ESCITALOPRAM 10 MG TABLET: 10 | 30 days supply | Qty: 30 | Fill #2

## 2016-12-23 ENCOUNTER — Ambulatory Visit (INDEPENDENT_AMBULATORY_CARE_PROVIDER_SITE_OTHER): Payer: 59 | Admitting: Psychology

## 2016-12-23 DIAGNOSIS — F4323 Adjustment disorder with mixed anxiety and depressed mood: Secondary | ICD-10-CM | POA: Diagnosis not present

## 2017-01-06 ENCOUNTER — Ambulatory Visit: Payer: Self-pay | Admitting: Psychology

## 2017-01-17 ENCOUNTER — Encounter: Payer: Self-pay | Admitting: Internal Medicine

## 2017-01-19 MED ORDER — BUSPIRONE HCL 5 MG PO TABS: 5.0000 mg | ORAL_TABLET | Freq: Two times a day (BID) | ORAL | 5 refills | Status: DC

## 2017-01-19 MED ORDER — ESCITALOPRAM OXALATE 20 MG PO TABS: 20.0000 mg | ORAL_TABLET | Freq: Every day | ORAL | 1 refills | Status: DC

## 2017-01-19 MED FILL — busPIRone HCL 5 MG TABS: 5 | 30 days supply | Qty: 60 | Fill #0

## 2017-01-19 MED FILL — ESCITALOPRAM 20 MG TABLET: 20 | 90 days supply | Qty: 90 | Fill #0

## 2017-01-20 MED FILL — ELFOLATE 15 MG TAB: 15 | 30 days supply | Qty: 30 | Fill #1

## 2017-01-31 DIAGNOSIS — F411 Generalized anxiety disorder: Secondary | ICD-10-CM | POA: Diagnosis not present

## 2017-02-18 MED FILL — busPIRone HCL 5 MG TABS: 5 | 30 days supply | Qty: 60 | Fill #1

## 2017-02-24 DIAGNOSIS — Z6825 Body mass index (BMI) 25.0-25.9, adult: Secondary | ICD-10-CM | POA: Diagnosis not present

## 2017-02-24 DIAGNOSIS — Z01419 Encounter for gynecological examination (general) (routine) without abnormal findings: Secondary | ICD-10-CM | POA: Diagnosis not present

## 2017-02-24 DIAGNOSIS — Z113 Encounter for screening for infections with a predominantly sexual mode of transmission: Secondary | ICD-10-CM | POA: Diagnosis not present

## 2017-03-10 ENCOUNTER — Emergency Department (INDEPENDENT_AMBULATORY_CARE_PROVIDER_SITE_OTHER): Payer: 59

## 2017-03-10 ENCOUNTER — Emergency Department
Admission: EM | Admit: 2017-03-10 | Discharge: 2017-03-10 | Disposition: A | Discharge status: Home / Self Care | Payer: 59 | Source: Home / Self Care | Attending: Family Medicine | Admitting: Family Medicine

## 2017-03-10 DIAGNOSIS — S6992XA Unspecified injury of left wrist, hand and finger(s), initial encounter: Secondary | ICD-10-CM | POA: Diagnosis not present

## 2017-03-10 DIAGNOSIS — S60222A Contusion of left hand, initial encounter: Secondary | ICD-10-CM

## 2017-03-10 DIAGNOSIS — M79642 Pain in left hand: Secondary | ICD-10-CM | POA: Diagnosis not present

## 2017-03-10 MED ORDER — HYDROCODONE-ACETAMINOPHEN 5-325 MG PO TABS: 1.0000 | ORAL_TABLET | Freq: Four times a day (QID) | ORAL | 0 refills | Status: DC | PRN

## 2017-03-10 NOTE — ED Provider Notes (Signed)
[facility]    CSN: [id number] Arrival date & time: 03/10/17  1822     History   Chief Complaint Chief Complaint  Patient presents with  . Hand Pain    left    HPI Julie Anderson is a 21 y.o. female.   Patient reports that a pickup truck tailgate fell and hit her left hand about 6 hours ago.  She has had persistent pain at the base of her thumb.   The history is provided by the patient.  Hand Injury  Location:  Hand Hand location:  L palm Injury: yes   Time since incident:  6 hours Mechanism of injury comment:  Contusion Pain details:    Quality:  Aching   Radiates to:  Does not radiate   Severity:  Moderate   Onset quality:  Sudden   Duration:  6 hours   Timing:  Constant   Progression:  Unchanged Handedness:  Right-handed Dislocation: no   Prior injury to area:  No Relieved by:  Nothing Worsened by:  Movement Ineffective treatments:  Ice Associated symptoms: decreased range of motion, stiffness and swelling   Associated symptoms: no numbness and no tingling     Past Medical History:  Diagnosis Date  . ADD (attention deficit disorder)   . Anxiety   . Depression     Patient Active Problem List   Diagnosis Date Noted  . Cough 09/14/2016  . Hyperlipidemia 07/05/2016  . Vitamin D deficiency 07/05/2016  . Hyperglycemia 07/05/2016  . Hyperhomocysteinemia ([place]) 07/05/2016  . Hyperacusis 07/05/2016  . Lumbar degenerative disc disease 06/25/2015  . Myofascial pain syndrome 06/06/2015  . ADD (attention deficit disorder) 09/24/2013  . Anxiety and depression 07/12/2013  . Auditory hallucination 07/12/2013  . Allergic rhinitis 07/12/2013    Past Surgical History:  Procedure Laterality Date  . TYMPANOSTOMY TUBE PLACEMENT      OB History    No data available       Home Medications    Prior to Admission medications   Medication Sig Start Date End Date Taking? Authorizing Provider  albuterol (PROVENTIL HFA;VENTOLIN HFA) 108 (90 BASE)  MCG/ACT inhaler Inhale 2 puffs into the lungs every 4 (four) hours as needed for wheezing or shortness of breath. 07/01/15   Mannam, Hart Robinsons, MD  albuterol (PROVENTIL HFA;VENTOLIN HFA) 108 (90 Base) MCG/ACT inhaler Inhale 2 puffs into the lungs every 6 (six) hours as needed for wheezing or shortness of breath. 11/09/16   Mannam, Hart Robinsons, MD  budesonide-formoterol (SYMBICORT) 160-4.5 MCG/ACT inhaler Inhale 2 puffs into the lungs 2 (two) times daily. 11/09/16   Mannam, Hart Robinsons, MD  budesonide-formoterol (SYMBICORT) 160-4.5 MCG/ACT inhaler Inhale 2 puffs into the lungs 2 (two) times daily. 11/09/16 11/10/16  Mannam, Hart Robinsons, MD  busPIRone (BUSPAR) 5 MG tablet Take 1 tablet (5 mg total) by mouth 2 (two) times daily. 01/19/17   Binnie Rail, MD  Cholecalciferol (VITAMIN D-3) 5000 units TABS Take by mouth daily.    [provider]  DAYTRANA 20 MG/9HR Place 1 patch onto the skin daily.  06/17/15   [provider]  escitalopram (LEXAPRO) 20 MG tablet Take 1 tablet (20 mg total) by mouth daily. 01/19/17   Binnie Rail, MD  fluticasone (FLONASE) 50 MCG/ACT nasal spray Place 2 sprays into both nostrils daily. 07/01/15   Mannam, Hart Robinsons, MD  folic acid (FOLVITE) 1 MG tablet Take 1 mg by mouth daily.    [provider]  HYDROcodone-acetaminophen (NORCO/VICODIN) 5-325 MG tablet Take 1  tablet by mouth every 6 (six) hours as needed for moderate pain. 03/10/17   Kandra Nicolas, MD  L-Methylfolate (ELFOLATE) 15 MG TABS  09/06/16   [provider]  loratadine (CLARITIN) 10 MG tablet Take 10 mg by mouth daily.    [provider]  Multiple Vitamins-Minerals (MULTIVITAMIN WITH MINERALS) tablet Take 1 tablet by mouth daily.    [provider]    Family History Family History  Problem Relation Age of Onset  . Adopted: Yes    Social History Social History  Substance Use Topics  . Smoking status: Never Smoker  . Smokeless tobacco: Never Used  . Alcohol use No      Allergies   Cymbalta [duloxetine hcl]   Review of Systems Review of Systems  Musculoskeletal: Positive for stiffness.  All other systems reviewed and are negative.    Physical Exam Triage Vital Signs ED Triage Vitals  Enc Vitals Group     BP 03/10/17 1845 112/72     Pulse Rate 03/10/17 1845 (!) 107     Resp --      Temp 03/10/17 1845 98.4 F (36.9 C)     Temp Source 03/10/17 1845 Oral     SpO2 03/10/17 1845 97 %     Weight 03/10/17 1845 120 lb (54.4 kg)     Height 03/10/17 1845 4\' 11"  (1.499 m)     Head Circumference --      Peak Flow --      Pain Score 03/10/17 1846 7     Pain Loc --      Pain Edu? --      Excl. in [place]? --    No data found.   Updated Vital Signs BP 112/72 (BP Location: Left Arm)   Pulse (!) 107   Temp 98.4 F (36.9 C) (Oral)   Ht 4\' 11"  (1.499 m)   Wt 120 lb (54.4 kg)   SpO2 97%   BMI 24.24 kg/m   Visual Acuity Right Eye Distance:   Left Eye Distance:   Bilateral Distance:    Right Eye Near:   Left Eye Near:    Bilateral Near:     Physical Exam  Constitutional: She appears well-developed and well-nourished. No distress.  HENT:  Head: Atraumatic.  Eyes: Pupils are equal, round, and reactive to light.  Neck: Normal range of motion.  Cardiovascular: Normal rate.   Pulmonary/Chest: Effort normal.  Musculoskeletal: She exhibits no edema.       Left hand: She exhibits decreased range of motion, tenderness and bony tenderness. She exhibits normal two-point discrimination, normal capillary refill, no deformity, no laceration and no swelling. Normal sensation noted.       Hands: Left hand has tenderness to palpation over the thenar eminence.  Decreased range of motion thumb.  Distal neurovascular function is intact.   Neurological: She is alert.  Skin: Skin is warm and dry.  Nursing note and vitals reviewed.    UC Treatments / Results  Labs (all labs ordered are listed, but only abnormal results are displayed) Labs Reviewed -  No data to display  EKG  EKG Interpretation None       Radiology Dg Hand Complete Left  Result Date: 03/10/2017 CLINICAL DATA:  21 y/o F; injury with pain radiating into the thumb. EXAM: LEFT HAND - COMPLETE 3+ VIEW COMPARISON:  None. FINDINGS: There is no evidence of fracture or dislocation. There is no evidence of arthropathy or other focal bone abnormality. Soft tissues  are unremarkable. IMPRESSION: Negative. Electronically Signed   By: Kristine Garbe M.D.   On: 03/10/2017 19:16    Procedures Procedures (including critical care time)  Medications Ordered in UC Medications - No data to display   Initial Impression / Assessment and Plan / UC Course  I have reviewed the triage vital signs and the nursing notes.  Pertinent labs & imaging results that were available during my care of the patient were reviewed by me and considered in my medical decision making (see chart for details).    Ace wrap applied.  Rx for Lortab.  Dispensed thumb spica splint. Apply ice pack for 30 minutes every 1 to 2 hours today and tomorrow.  Elevate.  Wear Ace wrap until swelling decreases, then begin wearing splint.  Begin range of motion and stretching exercises in about 5 days as per instruction sheet.  May take Ibuprofen 200mg , 4 tabs every 8 hours with food.  Followup with Dr. Aundria Mems or Dr. Lynne Leader ([facility]) if not improving about two weeks.     Final Clinical Impressions(s) / UC Diagnoses   Final diagnoses:  Contusion of left hand, initial encounter    New Prescriptions New Prescriptions   HYDROCODONE-ACETAMINOPHEN (NORCO/VICODIN) 5-325 MG TABLET    Take 1 tablet by mouth every 6 (six) hours as needed for moderate pain.     Kandra Nicolas, MD 03/11/17 [phone]

## 2017-03-10 NOTE — ED Triage Notes (Signed)
Tailgate of the truck fell on left hand.  No swelling noted.

## 2017-03-10 NOTE — Discharge Instructions (Signed)
Apply ice pack for 30 minutes every 1 to 2 hours today and tomorrow.  Elevate. Wear Ace wrap until swelling decreases.  Begin range of motion and stretching exercises in about 5 days as per instruction sheet.  May take Ibuprofen 200mg, 4 tabs every 8 hours with food.  °

## 2017-03-11 ENCOUNTER — Telehealth: Payer: Self-pay | Admitting: *Deleted

## 2017-03-11 NOTE — Telephone Encounter (Addendum)
Patient's mother called and left a message with a concern regarding [name]'s Rx for hydrocodone. She felt it was excessive and unnecessary given she did not have a fracture and her medical history. She would like something else called in.  Patient's mother Julie Anderson is a designated party release.   I called back and left a message that she can taken up to 800mg  Ibuprofen every 8 hours and alternate with tylenol. Ice, compression and elevation. Tramadol would be the only other option but that is a controlled substance as well. May opt to shred Rx or cut tabs in half. Dr. Assunta Found prescribed the amount within the recent STOP Act. I do not know the conversation that took place between him and [facility] regarding pain control. Her concerns are valid. Please call back as needed.

## 2017-03-12 ENCOUNTER — Telehealth: Payer: Self-pay | Admitting: Emergency Medicine

## 2017-03-12 NOTE — Telephone Encounter (Signed)
Courtsey call to patient, she states she is still hurting but it seems to be better. AP, CMA

## 2017-04-18 MED FILL — busPIRone HCL 5 MG TABS: 5 | 30 days supply | Qty: 60 | Fill #2

## 2017-05-09 MED FILL — ELFOLATE 15 MG TAB: 15 | 30 days supply | Qty: 30 | Fill #0

## 2017-05-23 MED FILL — busPIRone HCL 5 MG TABS: 5 | 30 days supply | Qty: 60 | Fill #3

## 2017-05-25 IMAGING — CR DG LUMBAR SPINE COMPLETE 4+V
5 series · 5 of 5 positions shown · non-contrast
Comparison: CT abdomen/ pelvis 01/10/2014, no similar prior imaging
for comparison.

CLINICAL DATA: Low back pain for 4 months, right greater than left

EXAM:
LUMBAR SPINE - COMPLETE 4+ VIEW

[l-spine ap]
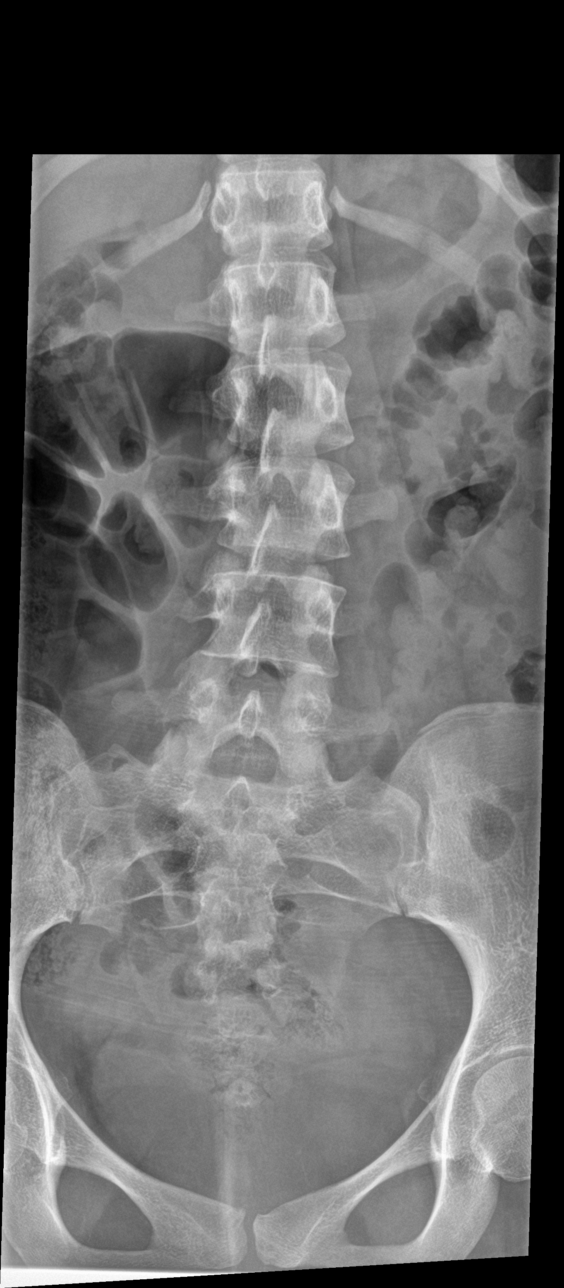

[l-spine obl (1 of 2)]
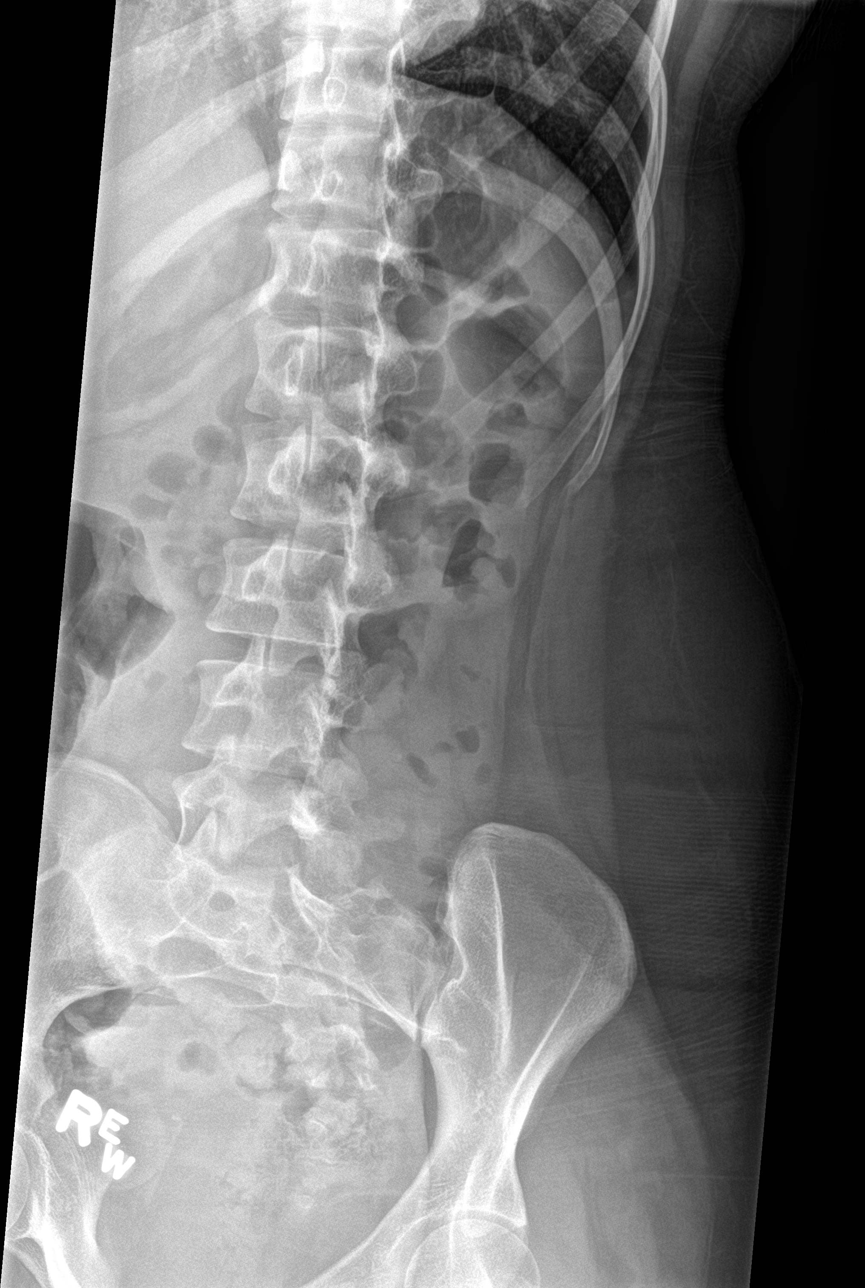

[l-spine obl (2 of 2)]
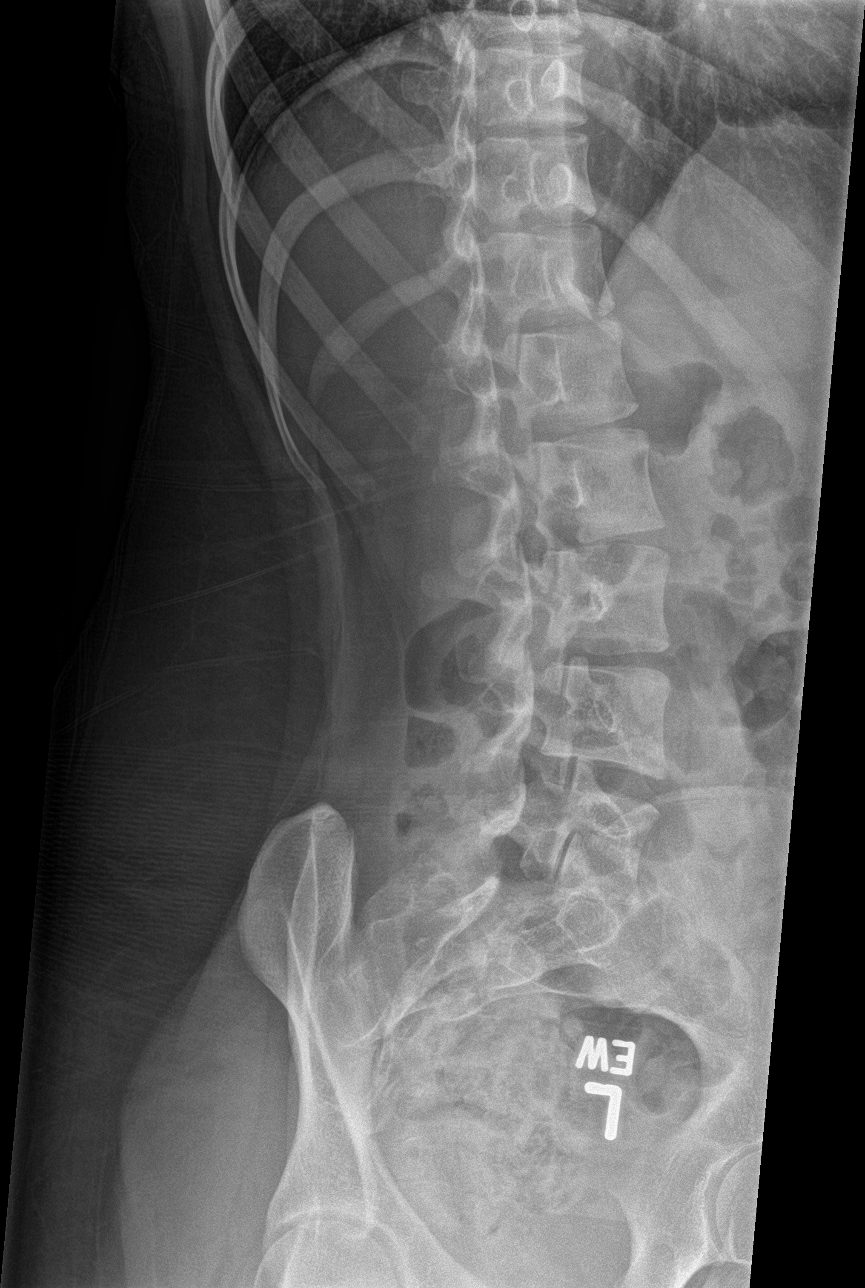

[l-spine lat]
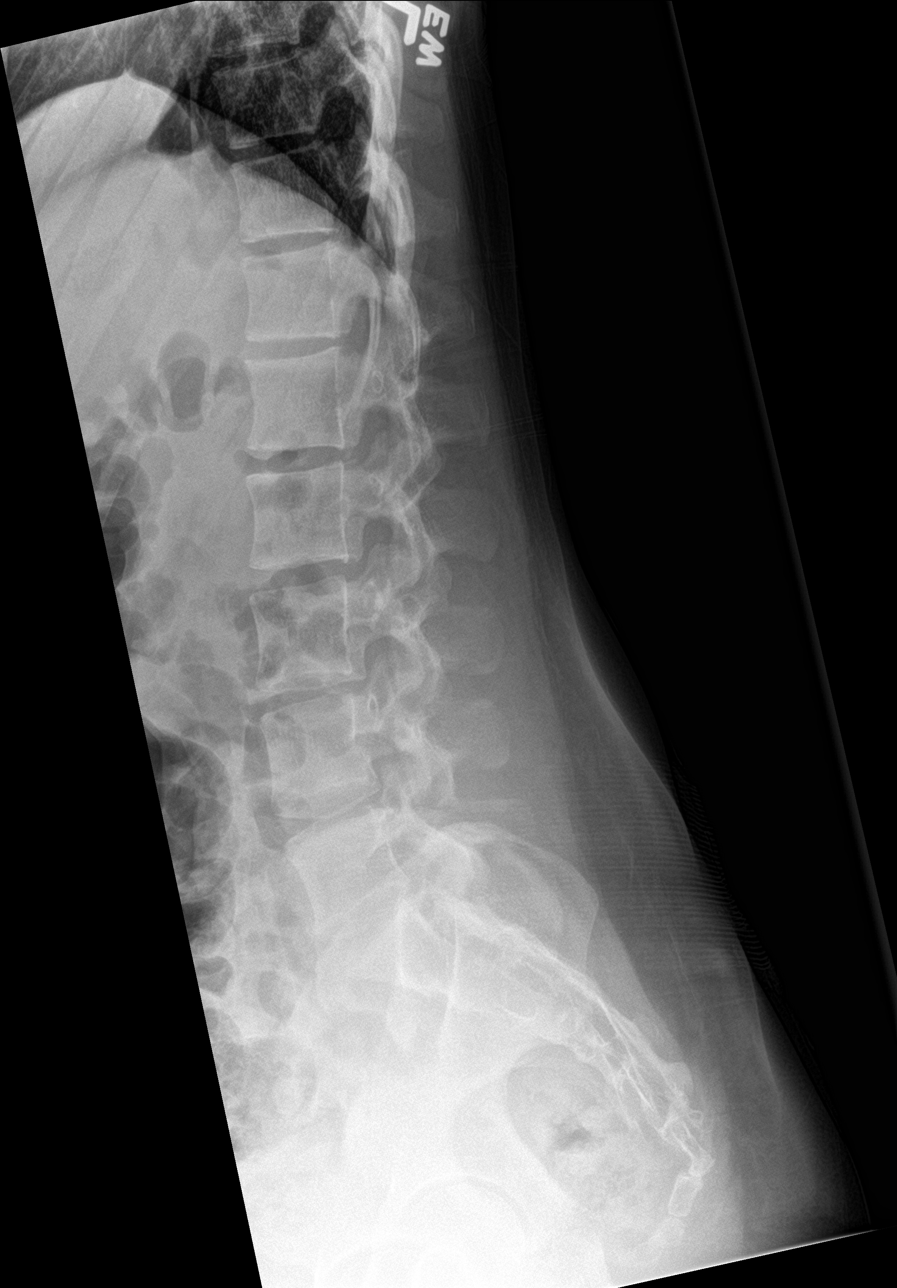

[l-spine spot]
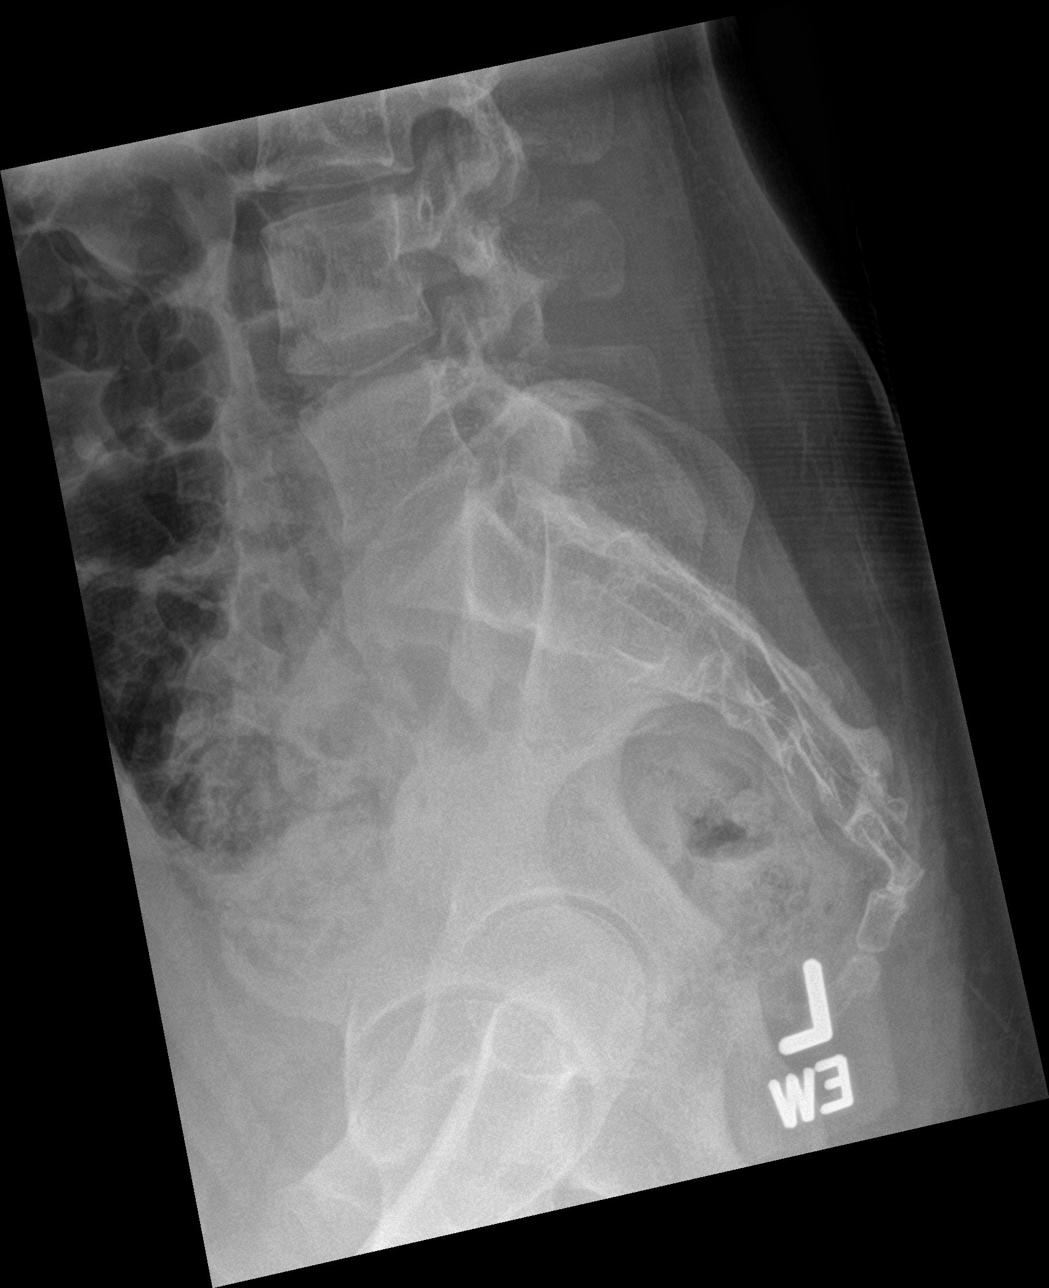

[5 of 5 positions shown; findings below may reference images not displayed]

FINDINGS: There is no evidence of lumbar spine fracture. Alignment is normal.
Intervertebral disc spaces are maintained. Minimal leftward
curvature centered at L2 noted.
IMPRESSION: Negative.

## 2017-05-26 ENCOUNTER — Ambulatory Visit: Payer: Self-pay

## 2017-05-29 NOTE — Progress Notes (Signed)
Subjective:    Patient ID: Julie Anderson, female    DOB: Jan 10, 1996, 21 y.o.   MRN: [id number]  HPI The patient is here for follow up.   Anxiety, depression:  She is taking the Lexapro daily as prescribed. She denies any side effects from the medication. She feels her depression is fairly controlled, but is still dealing dwith a lot of issues so still has some depression. Her anxiety does not feel controlled. She is taking the BuSpar twice daily at 7.5 mg. She does see a therapist.    She has made the decision that she is transferred sexual-like to become a female. She is currently seeing a therapist for this likely was helping her with resources. She would like to begin testosterone eventually and wonders about the risk that has.  She has hyper-homocystinemia and has elevated cholesterol. She is scheduled to see cardiology regarding both of these next month.  Hyperlipidemia: She has never been on medication. She states she does not eat that bad. She is currently not exercising regularly.  Hyperhomocystinemia: She has had genetic testing in the past that showed that she does have hyper homocystinemia. She has been taking a prescription for folate and would like a refill. With the genetic analysis that was done when she was diagnosed at does recommend caution with taking Lexapro.    hyperglycemia: She does have a history of elevated sugars. She is currently not exercising regularly. She states she doesn't eat that bad, but is not very specific. She has tried to decrease her sugar/carbohydrate intake.  Medications and allergies reviewed with patient and updated if appropriate.  Patient Active Problem List   Diagnosis Date Noted  . Cough 09/14/2016  . Hyperlipidemia 07/05/2016  . Vitamin D deficiency 07/05/2016  . Hyperglycemia 07/05/2016  . Hyperhomocysteinemia ([place]) 07/05/2016  . Hyperacusis 07/05/2016  . Lumbar degenerative disc disease 06/25/2015  . Myofascial pain syndrome 06/06/2015    . ADD (attention deficit disorder) 09/24/2013  . Anxiety and depression 07/12/2013  . Auditory hallucination 07/12/2013  . Allergic rhinitis 07/12/2013    Current Outpatient Prescriptions on File Prior to Visit  Medication Sig Dispense Refill  . albuterol (PROVENTIL HFA;VENTOLIN HFA) 108 (90 Base) MCG/ACT inhaler Inhale 2 puffs into the lungs every 6 (six) hours as needed for wheezing or shortness of breath. 1 Inhaler 2  . Cholecalciferol (VITAMIN D-3) 5000 units TABS Take by mouth daily.    Marland Kitchen escitalopram (LEXAPRO) 20 MG tablet Take 1 tablet (20 mg total) by mouth daily. 90 tablet 1  . loratadine (CLARITIN) 10 MG tablet Take 10 mg by mouth daily.    . Multiple Vitamins-Minerals (MULTIVITAMIN WITH MINERALS) tablet Take 1 tablet by mouth daily.    . budesonide-formoterol (SYMBICORT) 160-4.5 MCG/ACT inhaler Inhale 2 puffs into the lungs 2 (two) times daily. 1 Inhaler 0   No current facility-administered medications on file prior to visit.     Past Medical History:  Diagnosis Date  . ADD (attention deficit disorder)   . Anxiety   . Depression     Past Surgical History:  Procedure Laterality Date  . TYMPANOSTOMY TUBE PLACEMENT      Social History   Social History  . Marital status: Single    Spouse name: N/A  . Number of children: N/A  . Years of education: N/A   Social History Main Topics  . Smoking status: Never Smoker  . Smokeless tobacco: Never Used  . Alcohol use No  . Drug use: No  .  Sexual activity: Not Asked   Other Topics Concern  . None   Social History Conservation officer, historic buildings    No recent travel       Family History  Problem Relation Age of Onset  . Adopted: Yes    Review of Systems  Constitutional: Negative for fever.  Respiratory: Positive for cough (occasional), shortness of breath (related to asthma) and wheezing (occasional).   Cardiovascular: Negative for chest pain, palpitations and leg swelling.  Neurological: Negative for  light-headedness and headaches.  Psychiatric/Behavioral: Positive for dysphoric mood. The patient is nervous/anxious.        Objective:   Vitals:   05/30/17 1615  BP: 122/78  Pulse: 89  Resp: 16  Temp: 99.1 F (37.3 C)  SpO2: 99%   Wt Readings from Last 3 Encounters:  05/30/17 138 lb (62.6 kg)  03/10/17 120 lb (54.4 kg)  11/09/16 131 lb (59.4 kg) (56 %, Z= 0.14)*   * Growth percentiles are based on CDC 2-20 Years data.   Body mass index is 27.87 kg/m.   Physical Exam    Constitutional: Appears well-developed and well-nourished. No distress.  HENT:  Head: Normocephalic and atraumatic.  Neck: Neck supple. No tracheal deviation present. No thyromegaly present.  No cervical lymphadenopathy Cardiovascular: Normal rate, regular rhythm and normal heart sounds.   No murmur heard. No carotid bruit .  No edema Pulmonary/Chest: Effort normal and breath sounds normal. No respiratory distress. No has no wheezes. No rales.  Skin: Skin is warm and dry. Not diaphoretic.  Psyc depressed and affect. Behavior is normal.      Assessment & Plan:    See Problem List for Assessment and Plan of chronic medical problems.

## 2017-05-30 ENCOUNTER — Encounter: Payer: Self-pay | Admitting: Internal Medicine

## 2017-05-30 ENCOUNTER — Ambulatory Visit (INDEPENDENT_AMBULATORY_CARE_PROVIDER_SITE_OTHER): Payer: 59 | Admitting: Internal Medicine

## 2017-05-30 VITALS — BP 122/78 | HR 89 | Temp 99.1°F | Resp 16 | Wt 138.0 lb

## 2017-05-30 DIAGNOSIS — R739 Hyperglycemia, unspecified: Secondary | ICD-10-CM

## 2017-05-30 DIAGNOSIS — F329 Major depressive disorder, single episode, unspecified: Secondary | ICD-10-CM | POA: Diagnosis not present

## 2017-05-30 DIAGNOSIS — F419 Anxiety disorder, unspecified: Secondary | ICD-10-CM | POA: Diagnosis not present

## 2017-05-30 DIAGNOSIS — E7211 Homocystinuria: Secondary | ICD-10-CM

## 2017-05-30 DIAGNOSIS — F988 Other specified behavioral and emotional disorders with onset usually occurring in childhood and adolescence: Secondary | ICD-10-CM

## 2017-05-30 DIAGNOSIS — E7800 Pure hypercholesterolemia, unspecified: Secondary | ICD-10-CM

## 2017-05-30 DIAGNOSIS — F331 Major depressive disorder, recurrent, moderate: Secondary | ICD-10-CM | POA: Diagnosis not present

## 2017-05-30 DIAGNOSIS — Z23 Encounter for immunization: Secondary | ICD-10-CM | POA: Diagnosis not present

## 2017-05-30 DIAGNOSIS — F641 Gender identity disorder in adolescence and adulthood: Secondary | ICD-10-CM | POA: Diagnosis not present

## 2017-05-30 DIAGNOSIS — F32A Depression, unspecified: Secondary | ICD-10-CM

## 2017-05-30 DIAGNOSIS — F411 Generalized anxiety disorder: Secondary | ICD-10-CM | POA: Diagnosis not present

## 2017-05-30 MED ORDER — BUSPIRONE HCL 10 MG PO TABS: 10.0000 mg | ORAL_TABLET | Freq: Two times a day (BID) | ORAL | Status: DC

## 2017-05-30 MED ORDER — ELFOLATE 15 MG PO TABS: 1.0000 | ORAL_TABLET | Freq: Every day | ORAL | 3 refills | Status: DC

## 2017-05-30 NOTE — Assessment & Plan Note (Signed)
Check A1c Encouraged regular exercise and healthy diet

## 2017-05-30 NOTE — Assessment & Plan Note (Signed)
Depression fairly controlled, she will ask cardiology about the safety of taking the Lexapro-we'll continue for now Anxiety not ideally controlled-continue current dose of Lexapro and increase BuSpar-states she is currently taking 7.5 mg twice daily so we will increase to 10 mg twice daily She will continue to see her therapist

## 2017-05-30 NOTE — Assessment & Plan Note (Signed)
Will check lipid, cmp encouraged regular exercise and healthy diet Work on weight loss

## 2017-05-30 NOTE — Assessment & Plan Note (Signed)
Has had genetic testing Continue Elfolate - refilled today Will see cardiology given hyperlipidemia to discuss cardiac - she identifies herself as transgender and eventually wants to take testosterone - advised her to discuss this with cardiology and risk this will have

## 2017-05-30 NOTE — Patient Instructions (Signed)
  Test(s) ordered today. Your results will be released to [place] (or called to you) after review, usually within 72hours after test completion. If any changes need to be made, you will be notified at that same time.   Flu immunization administered today.   Medications reviewed and updated.  Changes include increasing the buspar to 10 mg twice daily.  Your prescription(s) have been submitted to your pharmacy. Please take as directed and contact our office if you believe you are having problem(s) with the medication(s).    Please followup in 6 months

## 2017-06-06 DIAGNOSIS — F331 Major depressive disorder, recurrent, moderate: Secondary | ICD-10-CM | POA: Diagnosis not present

## 2017-06-06 DIAGNOSIS — F411 Generalized anxiety disorder: Secondary | ICD-10-CM | POA: Diagnosis not present

## 2017-06-06 DIAGNOSIS — F641 Gender identity disorder in adolescence and adulthood: Secondary | ICD-10-CM | POA: Diagnosis not present

## 2017-06-13 DIAGNOSIS — F641 Gender identity disorder in adolescence and adulthood: Secondary | ICD-10-CM | POA: Diagnosis not present

## 2017-06-13 DIAGNOSIS — F411 Generalized anxiety disorder: Secondary | ICD-10-CM | POA: Diagnosis not present

## 2017-06-13 DIAGNOSIS — F331 Major depressive disorder, recurrent, moderate: Secondary | ICD-10-CM | POA: Diagnosis not present

## 2017-06-20 DIAGNOSIS — F331 Major depressive disorder, recurrent, moderate: Secondary | ICD-10-CM | POA: Diagnosis not present

## 2017-06-20 DIAGNOSIS — F411 Generalized anxiety disorder: Secondary | ICD-10-CM | POA: Diagnosis not present

## 2017-06-20 DIAGNOSIS — F641 Gender identity disorder in adolescence and adulthood: Secondary | ICD-10-CM | POA: Diagnosis not present

## 2017-06-20 MED FILL — ESCITALOPRAM 20 MG TABLET: 20 | 90 days supply | Qty: 90 | Fill #1

## 2017-06-20 MED FILL — busPIRone HCL 5 MG TABS: 5 | 30 days supply | Qty: 60 | Fill #4

## 2017-06-22 MED FILL — ELFOLATE 15 MG TAB: 15 | 90 days supply | Qty: 90 | Fill #0

## 2017-06-23 ENCOUNTER — Telehealth: Payer: Self-pay | Admitting: Emergency Medicine

## 2017-06-23 NOTE — Telephone Encounter (Signed)
Pts mother states Elfolate is no longer covered by insurance, it is currently $150. Please advise if there is an alternative.

## 2017-06-26 MED ORDER — FOLIC ACID-VIT B6-VIT B12 2.5-25-1 MG PO TABS: 1.0000 | ORAL_TABLET | Freq: Every day | ORAL | 3 refills | Status: DC

## 2017-06-26 NOTE — Telephone Encounter (Signed)
Alternative sent

## 2017-06-27 DIAGNOSIS — F331 Major depressive disorder, recurrent, moderate: Secondary | ICD-10-CM | POA: Diagnosis not present

## 2017-06-27 DIAGNOSIS — F411 Generalized anxiety disorder: Secondary | ICD-10-CM | POA: Diagnosis not present

## 2017-06-27 DIAGNOSIS — F641 Gender identity disorder in adolescence and adulthood: Secondary | ICD-10-CM | POA: Diagnosis not present

## 2017-06-27 MED ORDER — FOLIC ACID-VIT B6-VIT B12 2.5-25-1 MG PO TABS: 1.0000 | ORAL_TABLET | Freq: Every day | ORAL | 3 refills | Status: DC

## 2017-06-27 NOTE — Telephone Encounter (Signed)
Spoke with pts mother to inform new RX was sent to [facility]

## 2017-07-04 DIAGNOSIS — F411 Generalized anxiety disorder: Secondary | ICD-10-CM | POA: Diagnosis not present

## 2017-07-04 DIAGNOSIS — F641 Gender identity disorder in adolescence and adulthood: Secondary | ICD-10-CM | POA: Diagnosis not present

## 2017-07-04 DIAGNOSIS — F331 Major depressive disorder, recurrent, moderate: Secondary | ICD-10-CM | POA: Diagnosis not present

## 2017-07-11 DIAGNOSIS — F411 Generalized anxiety disorder: Secondary | ICD-10-CM | POA: Diagnosis not present

## 2017-07-11 DIAGNOSIS — F641 Gender identity disorder in adolescence and adulthood: Secondary | ICD-10-CM | POA: Diagnosis not present

## 2017-07-11 DIAGNOSIS — F331 Major depressive disorder, recurrent, moderate: Secondary | ICD-10-CM | POA: Diagnosis not present

## 2017-07-18 ENCOUNTER — Ambulatory Visit: Payer: 59 | Admitting: Cardiology

## 2017-09-13 DIAGNOSIS — F641 Gender identity disorder in adolescence and adulthood: Secondary | ICD-10-CM | POA: Diagnosis not present

## 2017-09-13 DIAGNOSIS — F411 Generalized anxiety disorder: Secondary | ICD-10-CM | POA: Diagnosis not present

## 2017-09-13 DIAGNOSIS — F331 Major depressive disorder, recurrent, moderate: Secondary | ICD-10-CM | POA: Diagnosis not present

## 2017-11-25 ENCOUNTER — Encounter: Payer: Self-pay | Admitting: *Deleted

## 2017-11-25 ENCOUNTER — Other Ambulatory Visit: Payer: Self-pay

## 2017-11-25 ENCOUNTER — Emergency Department
Admission: EM | Admit: 2017-11-25 | Discharge: 2017-11-25 | Disposition: A | Discharge status: Home / Self Care | Payer: 59 | Source: Home / Self Care

## 2017-11-25 DIAGNOSIS — J029 Acute pharyngitis, unspecified: Secondary | ICD-10-CM | POA: Diagnosis not present

## 2017-11-25 LAB — POCT RAPID STREP A (OFFICE): Rapid Strep A Screen: NEGATIVE

## 2017-11-25 MED ORDER — IBUPROFEN 800 MG PO TABS: 800.0000 mg | ORAL_TABLET | Freq: Three times a day (TID) | ORAL | 0 refills | Status: DC

## 2017-11-25 NOTE — Discharge Instructions (Signed)
Return if any problems.

## 2017-11-25 NOTE — ED Provider Notes (Signed)
[facility]    CSN: [id number] Arrival date & time: 11/25/17  1415     History   Chief Complaint Chief Complaint  Patient presents with  . Sore Throat    HPI CLYDINE PARKISON is a 22 y.o. female.   The history is provided by the patient. No language interpreter was used.  Sore Throat  This is a new problem. The current episode started yesterday. The problem occurs constantly. Pertinent negatives include no chest pain and no abdominal pain. Nothing aggravates the symptoms. Nothing relieves the symptoms. She has tried nothing for the symptoms. The treatment provided no relief.    Past Medical History:  Diagnosis Date  . ADD (attention deficit disorder)   . Anxiety   . Depression     Patient Active Problem List   Diagnosis Date Noted  . Cough 09/14/2016  . Hyperlipidemia 07/05/2016  . Vitamin D deficiency 07/05/2016  . Hyperglycemia 07/05/2016  . Hyperhomocysteinemia ([place]) 07/05/2016  . Hyperacusis 07/05/2016  . Lumbar degenerative disc disease 06/25/2015  . Myofascial pain syndrome 06/06/2015  . ADD (attention deficit disorder) 09/24/2013  . Anxiety and depression 07/12/2013  . Auditory hallucination 07/12/2013  . Allergic rhinitis 07/12/2013    Past Surgical History:  Procedure Laterality Date  . TYMPANOSTOMY TUBE PLACEMENT      OB History   None      Home Medications    Prior to Admission medications   Medication Sig Start Date End Date Taking? Authorizing Provider  albuterol (PROVENTIL HFA;VENTOLIN HFA) 108 (90 Base) MCG/ACT inhaler Inhale 2 puffs into the lungs every 6 (six) hours as needed for wheezing or shortness of breath. 11/09/16   Mannam, Hart Robinsons, MD  busPIRone (BUSPAR) 10 MG tablet Take 1 tablet (10 mg total) by mouth 2 (two) times daily. 05/30/17   Binnie Rail, MD  Cholecalciferol (VITAMIN D-3) 5000 units TABS Take by mouth daily.    [provider]  Folic Acid-Vit I3-KVQ Q59 (FOLBEE) 2.5-25-1 MG TABS tablet Take 1 tablet  by mouth daily. 06/27/17   Binnie Rail, MD  ibuprofen (ADVIL,MOTRIN) 800 MG tablet Take 1 tablet (800 mg total) by mouth 3 (three) times daily. 11/25/17   Fransico Meadow, PA-C  loratadine (CLARITIN) 10 MG tablet Take 10 mg by mouth daily.    [provider]  Multiple Vitamins-Minerals (MULTIVITAMIN WITH MINERALS) tablet Take 1 tablet by mouth daily.    [provider]    Family History Family History  Adopted: Yes    Social History Social History   Tobacco Use  . Smoking status: Never Smoker  . Smokeless tobacco: Never Used  Substance Use Topics  . Alcohol use: No  . Drug use: No     Allergies   Cymbalta [duloxetine hcl]   Review of Systems Review of Systems  HENT: Positive for sore throat.   Cardiovascular: Negative for chest pain.  Gastrointestinal: Negative for abdominal pain.  All other systems reviewed and are negative.    Physical Exam Triage Vital Signs ED Triage Vitals  Enc Vitals Group     BP 11/25/17 1454 114/74     Pulse Rate 11/25/17 1454 85     Resp --      Temp 11/25/17 1454 98.8 F (37.1 C)     Temp Source 11/25/17 1454 Oral     SpO2 11/25/17 1454 98 %     Weight 11/25/17 1455 138 lb (62.6 kg)     Height --  Head Circumference --      Peak Flow --      Pain Score 11/25/17 1455 7     Pain Loc --      Pain Edu? --      Excl. in [place]? --    No data found.  Updated Vital Signs BP 114/74 (BP Location: Right Arm)   Pulse 85   Temp 98.8 F (37.1 C) (Oral)   Wt 138 lb (62.6 kg)   LMP 11/04/2017   SpO2 98%   BMI 27.87 kg/m   Visual Acuity Right Eye Distance:   Left Eye Distance:   Bilateral Distance:    Right Eye Near:   Left Eye Near:    Bilateral Near:     Physical Exam  Constitutional: She appears well-developed and well-nourished. No distress.  HENT:  Head: Normocephalic and atraumatic.  Mouth/Throat: Posterior oropharyngeal erythema present.  Eyes: Conjunctivae are normal.  Neck: Neck supple.    Cardiovascular: Normal rate and regular rhythm.  No murmur heard. Pulmonary/Chest: Effort normal and breath sounds normal. No respiratory distress.  Abdominal: Soft. There is no tenderness.  Musculoskeletal: She exhibits no edema.  Neurological: She is alert.  Skin: Skin is warm and dry.  Psychiatric: She has a normal mood and affect.  Nursing note and vitals reviewed.    UC Treatments / Results  Labs (all labs ordered are listed, but only abnormal results are displayed) Labs Reviewed  STREP A DNA PROBE  POCT RAPID STREP A (OFFICE)    EKG None Radiology No results found.  Procedures Procedures (including critical care time)  Medications Ordered in UC Medications - No data to display   Initial Impression / Assessment and Plan / UC Course  I have reviewed the triage vital signs and the nursing notes.  Pertinent labs & imaging results that were available during my care of the patient were reviewed by me and considered in my medical decision making (see chart for details).     MDM  Strep is negative I suspect viral illness. Pt advised ibuprofen   Final Clinical Impressions(s) / UC Diagnoses   Final diagnoses:  Sore throat  Viral pharyngitis    ED Discharge Orders        Ordered    ibuprofen (ADVIL,MOTRIN) 800 MG tablet  3 times daily     11/25/17 1507      An After Visit Summary was printed and given to the patient.  Controlled Substance Prescriptions [place] Controlled Substance Registry consulted? Not Applicable   Fransico Meadow, [place] 11/25/17 1527

## 2017-11-25 NOTE — ED Triage Notes (Signed)
Patient c/o sore throat x yesterday. Afebrile.

## 2017-11-26 ENCOUNTER — Telehealth: Payer: Self-pay | Admitting: Emergency Medicine

## 2017-11-26 LAB — STREP A DNA PROBE: Group A Strep Probe: NOT DETECTED

## 2017-11-26 NOTE — Progress Notes (Signed)
Subjective:    Patient ID: Julie Anderson, female    DOB: 05-09-1996, 22 y.o.   MRN: [id number]  HPI The patient is here for follow up.  Anxiety: She stopped taking the buspar. She is living with her boyfriend and does not feel she needs the medication right now.  This  She feels her anxiety is well controlled and she is happy with her current dose of medication.   She denies depression. She feels she is doing well overall without medication.    Hyperglycemia:  She is starting to exercise a little more since the weather is getting nicer.  She is eating ok.  Hypercholesterolemia, homocysteinemia:  She is taking the folic acid daily.  She did not go and see cardiology - she was not sure why she was going there.  She is trying to start regular exercise.  She is trying to eat healthy.    Memory difficulties:  Over the past few months she has had difficulty with her memory.  She wonders about getting evaluated.  She has had difficulty remembering conversations and events or things she did with her friends.  She denies depression and anxiety.    She is taking her vitamin d daily.   Medications and allergies reviewed with patient and updated if appropriate.  Patient Active Problem List   Diagnosis Date Noted  . Cough 09/14/2016  . Hyperlipidemia 07/05/2016  . Vitamin D deficiency 07/05/2016  . Hyperglycemia 07/05/2016  . Hyperhomocysteinemia ([place]) 07/05/2016  . Hyperacusis 07/05/2016  . Lumbar degenerative disc disease 06/25/2015  . Myofascial pain syndrome 06/06/2015  . ADD (attention deficit disorder) 09/24/2013  . Anxiety and depression 07/12/2013  . Auditory hallucination 07/12/2013  . Allergic rhinitis 07/12/2013    Current Outpatient Medications on File Prior to Visit  Medication Sig Dispense Refill  . albuterol (PROVENTIL HFA;VENTOLIN HFA) 108 (90 Base) MCG/ACT inhaler Inhale 2 puffs into the lungs every 6 (six) hours as needed for wheezing or shortness of breath. 1 Inhaler 2   . Cholecalciferol (VITAMIN D-3) 5000 units TABS Take by mouth daily.    . Folic Acid-Vit Q9-UTM L46 (FOLBEE) 2.5-25-1 MG TABS tablet Take 1 tablet by mouth daily. 90 tablet 3  . ibuprofen (ADVIL,MOTRIN) 800 MG tablet Take 1 tablet (800 mg total) by mouth 3 (three) times daily. 21 tablet 0  . loratadine (CLARITIN) 10 MG tablet Take 10 mg by mouth daily.    . Multiple Vitamins-Minerals (MULTIVITAMIN WITH MINERALS) tablet Take 1 tablet by mouth daily.     No current facility-administered medications on file prior to visit.     Past Medical History:  Diagnosis Date  . ADD (attention deficit disorder)   . Anxiety   . Depression     Past Surgical History:  Procedure Laterality Date  . TYMPANOSTOMY TUBE PLACEMENT      Social History   Socioeconomic History  . Marital status: Single    Spouse name: Not on file  . Number of children: Not on file  . Years of education: Not on file  . Highest education level: Not on file  Occupational History  . Not on file  Social Needs  . Financial resource strain: Not on file  . Food insecurity:    Worry: Not on file    Inability: Not on file  . Transportation needs:    Medical: Not on file    Non-medical: Not on file  Tobacco Use  . Smoking status: Never Smoker  . Smokeless  tobacco: Never Used  Substance and Sexual Activity  . Alcohol use: No  . Drug use: No  . Sexual activity: Not on file  Lifestyle  . Physical activity:    Days per week: Not on file    Minutes per session: Not on file  . Stress: Not on file  Relationships  . Social connections:    Talks on phone: Not on file    Gets together: Not on file    Attends religious service: Not on file    Active member of club or organization: Not on file    Attends meetings of clubs or organizations: Not on file    Relationship status: Not on file  Other Topics Concern  . 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  HENT: Positive for sore throat (mild). Negative for congestion and ear pain.   Respiratory: Positive for cough (chronic). Negative for shortness of breath and wheezing.   Cardiovascular: Negative for chest pain and palpitations.  Neurological: Positive for headaches (occ). Negative for light-headedness.       Objective:   Vitals:   11/28/17 1353  BP: 122/84  Pulse: 86  Resp: 16  Temp: 98.8 F (37.1 C)  SpO2: 99%   BP Readings from Last 3 Encounters:  11/28/17 122/84  11/25/17 114/74  05/30/17 122/78   Wt Readings from Last 3 Encounters:  11/28/17 139 lb (63 kg)  11/25/17 138 lb (62.6 kg)  05/30/17 138 lb (62.6 kg)   Body mass index is 28.07 kg/m.   Physical Exam    Constitutional: Appears well-developed and well-nourished. No distress.  HENT:  Head: Normocephalic and atraumatic.  Neck: Neck supple. No tracheal deviation present. No thyromegaly present.  No cervical lymphadenopathy Cardiovascular: Normal rate, regular rhythm and normal heart sounds.   No murmur heard. No carotid bruit .  No edema Pulmonary/Chest: Effort normal and breath sounds normal. No respiratory distress. No has no wheezes. No rales.  Skin: Skin is warm and dry. Not diaphoretic.  Psychiatric: Depressed mood and affect. Behavior is normal.      Assessment & Plan:    See Problem List for Assessment and Plan of chronic medical problems.

## 2017-11-28 ENCOUNTER — Other Ambulatory Visit (INDEPENDENT_AMBULATORY_CARE_PROVIDER_SITE_OTHER): Payer: 59

## 2017-11-28 ENCOUNTER — Encounter: Payer: Self-pay | Admitting: Internal Medicine

## 2017-11-28 ENCOUNTER — Ambulatory Visit: Payer: 59 | Admitting: Internal Medicine

## 2017-11-28 VITALS — BP 122/84 | HR 86 | Temp 98.8°F | Resp 16 | Wt 139.0 lb

## 2017-11-28 DIAGNOSIS — F419 Anxiety disorder, unspecified: Secondary | ICD-10-CM

## 2017-11-28 DIAGNOSIS — R413 Other amnesia: Secondary | ICD-10-CM | POA: Diagnosis not present

## 2017-11-28 DIAGNOSIS — F329 Major depressive disorder, single episode, unspecified: Secondary | ICD-10-CM

## 2017-11-28 DIAGNOSIS — R739 Hyperglycemia, unspecified: Secondary | ICD-10-CM

## 2017-11-28 DIAGNOSIS — E7211 Homocystinuria: Secondary | ICD-10-CM

## 2017-11-28 DIAGNOSIS — E7800 Pure hypercholesterolemia, unspecified: Secondary | ICD-10-CM | POA: Diagnosis not present

## 2017-11-28 DIAGNOSIS — F649 Gender identity disorder, unspecified: Secondary | ICD-10-CM | POA: Insufficient documentation

## 2017-11-28 DIAGNOSIS — E559 Vitamin D deficiency, unspecified: Secondary | ICD-10-CM

## 2017-11-28 DIAGNOSIS — F32A Depression, unspecified: Secondary | ICD-10-CM

## 2017-11-28 DIAGNOSIS — E7849 Other hyperlipidemia: Secondary | ICD-10-CM | POA: Diagnosis not present

## 2017-11-28 LAB — VITAMIN B12: Vitamin B-12: 416 pg/mL (ref 211–911)

## 2017-11-28 LAB — LIPID PANEL
CHOLESTEROL: 304 mg/dL — AB (ref 0–200)
HDL: 56.2 mg/dL (ref >39.00)
LDL Cholesterol: 217 mg/dL — ABNORMAL HIGH (ref 0–99)
NonHDL: 247.94
Total CHOL/HDL Ratio: 5
Triglycerides: 153 mg/dL — ABNORMAL HIGH (ref 0.0–149.0)
VLDL: 30.6 mg/dL (ref 0.0–40.0)

## 2017-11-28 LAB — COMPREHENSIVE METABOLIC PANEL
ALBUMIN: 4.4 g/dL (ref 3.5–5.2)
ALK PHOS: 80 U/L (ref 39–117)
ALT: 28 U/L (ref 0–35)
AST: 21 U/L (ref 0–37)
BUN: 15 mg/dL (ref 6–23)
CO2: 26 mEq/L (ref 19–32)
CREATININE: 0.72 mg/dL (ref 0.40–1.20)
Calcium: 9.9 mg/dL (ref 8.4–10.5)
Chloride: 102 mEq/L (ref 96–112)
GFR: 108.91 mL/min (ref >60.00)
Glucose, Bld: 102 mg/dL — ABNORMAL HIGH (ref 70–99)
Potassium: 3.9 mEq/L (ref 3.5–5.1)
SODIUM: 138 meq/L (ref 135–145)
TOTAL PROTEIN: 8.3 g/dL (ref 6.0–8.3)
Total Bilirubin: 0.5 mg/dL (ref 0.2–1.2)

## 2017-11-28 LAB — TSH: TSH: 1.58 u[IU]/mL (ref 0.35–5.50)

## 2017-11-28 LAB — VITAMIN D 25 HYDROXY (VIT D DEFICIENCY, FRACTURES): VITD: 25.31 ng/mL — ABNORMAL LOW (ref 30.00–100.00)

## 2017-11-28 LAB — HEMOGLOBIN A1C: Hgb A1c MFr Bld: 5.7 % (ref 4.6–6.5)

## 2017-11-28 NOTE — Assessment & Plan Note (Signed)
Taking folic acid daily Check homocysteine

## 2017-11-28 NOTE — Assessment & Plan Note (Addendum)
Her memory is getting worse over the past 3-4 years She can not remember events she had with friends in the past - she will not remember them She will forget whole conversations She denies depression or anxiety, but has a history of both and has intermittent anxiety Will check tsh, b12 level Consider neuropsych eval - she will let me know Her memory difficulties are likely related to underlying depression and anxiety

## 2017-11-28 NOTE — Assessment & Plan Note (Signed)
She no longer wants to change genders

## 2017-11-28 NOTE — Assessment & Plan Note (Signed)
Taking vitamin d  Will check vitamin d level

## 2017-11-28 NOTE — Assessment & Plan Note (Signed)
States anxiety is intermittent  - does not feel she needs any buspar at this time No longer taking her anti-depressant - feels depression is controlled Monitor off medication

## 2017-11-28 NOTE — Assessment & Plan Note (Signed)
Check a1c Low sugar / carb diet Stressed regular exercise   

## 2017-11-28 NOTE — Patient Instructions (Signed)
  Test(s) ordered today. Your results will be released to MyChart (or called to you) after review, usually within 72hours after test completion. If any changes need to be made, you will be notified at that same time.   Medications reviewed and updated.  No changes recommended at this time.   Please followup in one year   

## 2017-11-29 LAB — HOMOCYSTEINE: Homocysteine: 7.8 umol/L (ref <10.4)

## 2017-12-01 ENCOUNTER — Encounter: Payer: Self-pay | Admitting: Internal Medicine

## 2017-12-05 ENCOUNTER — Encounter: Payer: Self-pay | Admitting: Internal Medicine

## 2017-12-28 ENCOUNTER — Encounter: Payer: Self-pay | Admitting: Internal Medicine

## 2018-01-16 MED FILL — FOLBEE TABLET: 2.5-25-1 | 90 days supply | Qty: 90 | Fill #0

## 2018-01-18 DIAGNOSIS — Z3046 Encounter for surveillance of implantable subdermal contraceptive: Secondary | ICD-10-CM | POA: Diagnosis not present

## 2018-01-31 ENCOUNTER — Encounter: Payer: Self-pay | Admitting: Internal Medicine

## 2018-01-31 DIAGNOSIS — R413 Other amnesia: Secondary | ICD-10-CM

## 2018-02-08 ENCOUNTER — Encounter: Payer: Self-pay | Admitting: Psychology

## 2018-02-23 ENCOUNTER — Other Ambulatory Visit: Payer: Self-pay | Admitting: Internal Medicine

## 2018-02-23 MED ORDER — CIPROFLOXACIN HCL 500 MG PO TABS: 500.0000 mg | ORAL_TABLET | Freq: Two times a day (BID) | ORAL | 0 refills | Status: DC

## 2018-02-23 MED FILL — CIPROFLOXACIN HCL 500 MG TA: 500 | 3 days supply | Qty: 6 | Fill #0

## 2018-04-13 DIAGNOSIS — Z01419 Encounter for gynecological examination (general) (routine) without abnormal findings: Secondary | ICD-10-CM | POA: Diagnosis not present

## 2018-04-13 DIAGNOSIS — Z6827 Body mass index (BMI) 27.0-27.9, adult: Secondary | ICD-10-CM | POA: Diagnosis not present

## 2018-04-27 DIAGNOSIS — F331 Major depressive disorder, recurrent, moderate: Secondary | ICD-10-CM | POA: Diagnosis not present

## 2018-04-27 DIAGNOSIS — F411 Generalized anxiety disorder: Secondary | ICD-10-CM | POA: Diagnosis not present

## 2018-05-12 DIAGNOSIS — H52203 Unspecified astigmatism, bilateral: Secondary | ICD-10-CM | POA: Diagnosis not present

## 2018-05-16 NOTE — Progress Notes (Signed)
Subjective:    Patient ID: Julie Anderson, female    DOB: 11-Sep-1995, 22 y.o.   MRN: [id number]  HPI The patient is here for an acute visit.   Left and right ear flakiness with pus:   Several months ago her symtpoms started - flakiness and itching in both ear canals.  It has been intermittent.  She has not tried anything.  She has not itched inside her ears with her finger or a Q-tip.  She has had intermittent pus for approximately a month and a half.  Her symptoms are intermittent.  She denies ear pain.  She denies any changes in her hearing.  She denies any fevers or cold symptoms.  She would also like to get the flu shot.   Medications and allergies reviewed with patient and updated if appropriate.  Patient Active Problem List   Diagnosis Date Noted  . Memory difficulties 11/28/2017  . Gender identity disorder 11/28/2017  . Cough 09/14/2016  . Hyperlipidemia 07/05/2016  . Vitamin D deficiency 07/05/2016  . Hyperglycemia 07/05/2016  . Hyperhomocysteinemia ([place]) 07/05/2016  . Hyperacusis 07/05/2016  . Lumbar degenerative disc disease 06/25/2015  . Myofascial pain syndrome 06/06/2015  . ADD (attention deficit disorder) 09/24/2013  . Anxiety and depression 07/12/2013  . Auditory hallucination 07/12/2013  . Allergic rhinitis 07/12/2013    Current Outpatient Medications on File Prior to Visit  Medication Sig Dispense Refill  . Cholecalciferol (VITAMIN D-3) 5000 units TABS Take by mouth daily.    . Folic Acid-Vit X3-ATF T73 (FOLBEE) 2.5-25-1 MG TABS tablet Take 1 tablet by mouth daily. 90 tablet 3  . Multiple Vitamins-Minerals (MULTIVITAMIN WITH MINERALS) tablet Take 1 tablet by mouth daily.     No current facility-administered medications on file prior to visit.     Past Medical History:  Diagnosis Date  . ADD (attention deficit disorder)   . Anxiety   . Depression     Past Surgical History:  Procedure Laterality Date  . TYMPANOSTOMY TUBE PLACEMENT      Social  History   Socioeconomic History  . Marital status: Single    Spouse name: Not on file  . Number of children: Not on file  . Years of education: Not on file  . Highest education level: Not on file  Occupational History  . Not on file  Social Needs  . Financial resource strain: Not on file  . Food insecurity:    Worry: Not on file    Inability: Not on file  . Transportation needs:    Medical: Not on file    Non-medical: Not on file  Tobacco Use  . Smoking status: Never Smoker  . Smokeless tobacco: Never Used  Substance and Sexual Activity  . Alcohol use: No  . Drug use: No  . Sexual activity: Not on file  Lifestyle  . Physical activity:    Days per week: Not on file    Minutes per session: Not on file  . Stress: Not on file  Relationships  . Social connections:    Talks on phone: Not on file    Gets together: Not on file    Attends religious service: Not on file    Active member of club or organization: Not on file    Attends meetings of clubs or organizations: Not on file    Relationship status: Not on file  Other Topics Concern  . 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  HENT: Negative for congestion, ear pain, hearing loss and sinus pain.   Neurological: Negative for headaches.       Objective:   Vitals:   05/17/18 0814  BP: 118/78  Pulse: (!) 106  Resp: 16  Temp: 98.4 F (36.9 C)  SpO2: 98%   BP Readings from Last 3 Encounters:  05/17/18 118/78  11/28/17 122/84  11/25/17 114/74   Wt Readings from Last 3 Encounters:  05/17/18 135 lb (61.2 kg)  11/28/17 139 lb (63 kg)  11/25/17 138 lb (62.6 kg)   Body mass index is 27.27 kg/m.   Physical Exam  Constitutional: She appears well-developed and well-nourished. No distress.  HENT:  Head: Normocephalic and atraumatic.  Right Ear: External ear normal.    Left Ear: External ear normal.  Mouth/Throat: Oropharynx is clear and moist.  B/l ear canals with scant cerumen and no erythema.  Minimal dry skin.  No pus,  B/l TM normal  Eyes: Conjunctivae are normal.  Neck: Neck supple. No tracheal deviation present. No thyromegaly present.  Musculoskeletal: She exhibits no edema.  Lymphadenopathy:    She has no cervical adenopathy.  Skin: Skin is warm and dry. She is not diaphoretic.          Assessment & Plan:    See Problem List for Assessment and Plan of chronic medical problems.

## 2018-05-17 ENCOUNTER — Ambulatory Visit: Payer: 59 | Admitting: Internal Medicine

## 2018-05-17 ENCOUNTER — Encounter: Payer: Self-pay | Admitting: Internal Medicine

## 2018-05-17 VITALS — BP 118/78 | HR 106 | Temp 98.4°F | Resp 16 | Ht 59.0 in | Wt 135.0 lb

## 2018-05-17 DIAGNOSIS — Z23 Encounter for immunization: Secondary | ICD-10-CM | POA: Diagnosis not present

## 2018-05-17 DIAGNOSIS — L219 Seborrheic dermatitis, unspecified: Secondary | ICD-10-CM | POA: Diagnosis not present

## 2018-05-17 MED ORDER — FLUOCINOLONE ACETONIDE 0.01 % OT OIL: 5.0000 [drp] | TOPICAL_OIL | Freq: Two times a day (BID) | OTIC | 0 refills | Status: DC | PRN

## 2018-05-17 MED FILL — FLUOCINOLONE ACETONIDE 0.01: 0.01 | 20 days supply | Qty: 20 | Fill #0

## 2018-05-17 NOTE — Assessment & Plan Note (Signed)
B/l ear canals Mild and intermittent Will prescribe dermotic oil - use as needed Call if no improvement

## 2018-05-17 NOTE — Patient Instructions (Signed)
Flu immunization administered today.    Medications reviewed and updated.  Changes include trying an ear drop as needed for the itching and dryness.   Your prescription(s) have been submitted to your pharmacy. Please take as directed and contact our office if you believe you are having problem(s) with the medication(s).

## 2018-05-27 ENCOUNTER — Ambulatory Visit (HOSPITAL_COMMUNITY)
Admission: EM | Admit: 2018-05-27 | Discharge: 2018-05-27 | Disposition: A | Discharge status: Home / Self Care | Payer: 59 | Attending: Family Medicine | Admitting: Family Medicine

## 2018-05-27 ENCOUNTER — Other Ambulatory Visit: Payer: Self-pay

## 2018-05-27 ENCOUNTER — Encounter (HOSPITAL_COMMUNITY): Payer: Self-pay

## 2018-05-27 DIAGNOSIS — N3090 Cystitis, unspecified without hematuria: Secondary | ICD-10-CM | POA: Diagnosis not present

## 2018-05-27 DIAGNOSIS — R319 Hematuria, unspecified: Secondary | ICD-10-CM | POA: Diagnosis present

## 2018-05-27 LAB — POCT PREGNANCY, URINE: PREG TEST UR: NEGATIVE

## 2018-05-27 LAB — POCT URINALYSIS DIP (DEVICE)
Glucose, UA: 100 mg/dL — AB
KETONES UR: 15 mg/dL — AB
NITRITE: POSITIVE — AB
Protein, ur: >=300 mg/dL — AB
Specific Gravity, Urine: 1.02 (ref 1.005–1.030)
Urobilinogen, UA: 4 mg/dL — ABNORMAL HIGH (ref 0.0–1.0)
pH: 5.5 (ref 5.0–8.0)

## 2018-05-27 MED ORDER — CEPHALEXIN 500 MG PO CAPS: 500.0000 mg | ORAL_CAPSULE | Freq: Two times a day (BID) | ORAL | 0 refills | Status: DC

## 2018-05-27 NOTE — Discharge Instructions (Signed)
We have sent a urine culture and will let you know of any positive findings.

## 2018-05-27 NOTE — ED Triage Notes (Signed)
Pt states she has blood in her urine this happened 1 hour ago today.

## 2018-05-30 ENCOUNTER — Telehealth (HOSPITAL_COMMUNITY): Payer: Self-pay

## 2018-05-30 LAB — URINE CULTURE

## 2018-05-30 MED ORDER — SULFAMETHOXAZOLE-TRIMETHOPRIM 800-160 MG PO TABS: 1.0000 | ORAL_TABLET | Freq: Two times a day (BID) | ORAL | 0 refills | Status: AC

## 2018-05-30 MED FILL — SULFAMETHOXAZOLE-TMP DS TAB: 800-160 | 5 days supply | Qty: 10 | Fill #0

## 2018-05-30 NOTE — Telephone Encounter (Signed)
Urine culture positive for E.coli. Not properly treated with Keflex. Rx for Bactrim bid x 5 days sent to pharmacy of choice. Pt called and made aware.

## 2018-05-31 NOTE — ED Provider Notes (Signed)
[place]    ASSESSMENT & PLAN:  1. Cystitis     Meds ordered this encounter  Medications  . cephALEXin (KEFLEX) 500 MG capsule    Sig: Take 1 capsule (500 mg total) by mouth 2 (two) times daily.    Dispense:  10 capsule    Refill:  0    Urine culture sent. Will notify patient when results available. Will follow up with her PCP or here if not showing improvement over the next 48 hours, sooner if needed.  Outlined signs and symptoms indicating need for more acute intervention. Patient verbalized understanding. After Visit Summary given.  SUBJECTIVE:  Julie Anderson is a 22 y.o. female who complains of seeing blood in her urine. No flank pain, fever, chills, abnormal vaginal discharge. Normal PO intake. No abdominal pain. No self treatment. Ambulatory without difficulty. No specific symptoms of urinary frequency or dysuria.  LMP: No LMP recorded. Patient has had an implant.  ROS: As in HPI.  OBJECTIVE:  Vitals:   05/27/18 1734 05/27/18 1736  BP:  121/76  Pulse:  92  Resp:  16  Temp:  98.1 F (36.7 C)  SpO2:  100%  Weight: 59 kg    Appears well, in no apparent distress. Abdomen is soft without tenderness, guarding, mass, rebound or organomegaly. No CVA tenderness or inguinal adenopathy noted.  Labs Reviewed  URINE CULTURE - Abnormal; Notable for the following components:      Result Value   Culture   (*)    Value: >=100,000 COLONIES/mL ESCHERICHIA COLI Confirmed Extended Spectrum Beta-Lactamase Producer (ESBL).  In bloodstream infections from ESBL organisms, carbapenems are preferred over piperacillin/tazobactam. They are shown to have a lower risk of mortality.    Organism ID, Bacteria ESCHERICHIA COLI (*)    All other components within normal limits  POCT URINALYSIS DIP (DEVICE) - Abnormal; Notable for the following components:   Glucose, UA 100 (*)    Bilirubin Urine LARGE (*)    Ketones, ur 15 (*)    Hgb urine dipstick LARGE (*)    Protein, ur  >=300 (*)    Urobilinogen, UA 4.0 (*)    Nitrite POSITIVE (*)    Leukocytes, UA LARGE (*)    All other components within normal limits  POCT PREGNANCY, URINE    Allergies  Allergen Reactions  . Cymbalta [Duloxetine Hcl]     Nausea, disorientation    Past Medical History:  Diagnosis Date  . ADD (attention deficit disorder)   . Anxiety   . Depression    Social History   Socioeconomic History  . Marital status: Single    Spouse name: Not on file  . Number of children: Not on file  . Years of education: Not on file  . Highest education level: Not on file  Occupational History  . Not on file  Social Needs  . Financial resource strain: Not on file  . Food insecurity:    Worry: Not on file    Inability: Not on file  . Transportation needs:    Medical: Not on file    Non-medical: Not on file  Tobacco Use  . Smoking status: Never Smoker  . Smokeless tobacco: Never Used  Substance and Sexual Activity  . Alcohol use: No  . Drug use: No  . Sexual activity: Not on file  Lifestyle  . Physical activity:    Days per week: Not on file    Minutes per session: Not on file  . Stress:  Not on file  Relationships  . Social connections:    Talks on phone: Not on file    Gets together: Not on file    Attends religious service: Not on file    Active member of club or organization: Not on file    Attends meetings of clubs or organizations: Not on file    Relationship status: Not on file  . Intimate partner violence:    Fear of current or ex partner: Not on file    Emotionally abused: Not on file    Physically abused: Not on file    Forced sexual activity: Not on file  Other Topics Concern  . 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

## 2018-06-21 DIAGNOSIS — F331 Major depressive disorder, recurrent, moderate: Secondary | ICD-10-CM | POA: Diagnosis not present

## 2018-06-21 DIAGNOSIS — F411 Generalized anxiety disorder: Secondary | ICD-10-CM | POA: Diagnosis not present

## 2018-06-27 DIAGNOSIS — F411 Generalized anxiety disorder: Secondary | ICD-10-CM | POA: Diagnosis not present

## 2018-06-27 DIAGNOSIS — F331 Major depressive disorder, recurrent, moderate: Secondary | ICD-10-CM | POA: Diagnosis not present

## 2018-06-30 DIAGNOSIS — F331 Major depressive disorder, recurrent, moderate: Secondary | ICD-10-CM | POA: Diagnosis not present

## 2018-06-30 DIAGNOSIS — F411 Generalized anxiety disorder: Secondary | ICD-10-CM | POA: Diagnosis not present

## 2018-07-04 DIAGNOSIS — F411 Generalized anxiety disorder: Secondary | ICD-10-CM | POA: Diagnosis not present

## 2018-07-04 DIAGNOSIS — F331 Major depressive disorder, recurrent, moderate: Secondary | ICD-10-CM | POA: Diagnosis not present

## 2018-07-12 DIAGNOSIS — F411 Generalized anxiety disorder: Secondary | ICD-10-CM | POA: Diagnosis not present

## 2018-07-12 DIAGNOSIS — F331 Major depressive disorder, recurrent, moderate: Secondary | ICD-10-CM | POA: Diagnosis not present

## 2018-07-14 DIAGNOSIS — F411 Generalized anxiety disorder: Secondary | ICD-10-CM | POA: Diagnosis not present

## 2018-07-14 DIAGNOSIS — F331 Major depressive disorder, recurrent, moderate: Secondary | ICD-10-CM | POA: Diagnosis not present

## 2018-07-31 ENCOUNTER — Encounter: Payer: Self-pay | Admitting: Internal Medicine

## 2018-07-31 DIAGNOSIS — F329 Major depressive disorder, single episode, unspecified: Secondary | ICD-10-CM

## 2018-07-31 DIAGNOSIS — F32A Depression, unspecified: Secondary | ICD-10-CM

## 2018-07-31 DIAGNOSIS — F419 Anxiety disorder, unspecified: Principal | ICD-10-CM

## 2018-08-01 ENCOUNTER — Encounter: Payer: Self-pay | Admitting: Psychology

## 2018-08-10 DIAGNOSIS — F331 Major depressive disorder, recurrent, moderate: Secondary | ICD-10-CM | POA: Diagnosis not present

## 2018-08-10 DIAGNOSIS — F411 Generalized anxiety disorder: Secondary | ICD-10-CM | POA: Diagnosis not present

## 2018-09-05 ENCOUNTER — Encounter: Payer: Self-pay | Admitting: Psychology

## 2018-09-07 ENCOUNTER — Encounter: Payer: Self-pay | Admitting: Internal Medicine

## 2019-07-11 ENCOUNTER — Other Ambulatory Visit: Payer: Self-pay

## 2019-07-11 ENCOUNTER — Emergency Department (HOSPITAL_COMMUNITY)
Admission: EM | Admit: 2019-07-11 | Discharge: 2019-07-12 | Disposition: A | Discharge status: Home / Self Care | Payer: 59 | Attending: Emergency Medicine | Admitting: Emergency Medicine

## 2019-07-11 ENCOUNTER — Emergency Department (HOSPITAL_COMMUNITY): Payer: 59

## 2019-07-11 ENCOUNTER — Encounter (HOSPITAL_COMMUNITY): Payer: Self-pay

## 2019-07-11 DIAGNOSIS — G43109 Migraine with aura, not intractable, without status migrainosus: Secondary | ICD-10-CM | POA: Diagnosis not present

## 2019-07-11 DIAGNOSIS — G43809 Other migraine, not intractable, without status migrainosus: Secondary | ICD-10-CM

## 2019-07-11 DIAGNOSIS — R42 Dizziness and giddiness: Secondary | ICD-10-CM | POA: Diagnosis not present

## 2019-07-11 DIAGNOSIS — R29810 Facial weakness: Secondary | ICD-10-CM | POA: Diagnosis not present

## 2019-07-11 DIAGNOSIS — Z79899 Other long term (current) drug therapy: Secondary | ICD-10-CM | POA: Insufficient documentation

## 2019-07-11 DIAGNOSIS — F909 Attention-deficit hyperactivity disorder, unspecified type: Secondary | ICD-10-CM | POA: Insufficient documentation

## 2019-07-11 DIAGNOSIS — R531 Weakness: Secondary | ICD-10-CM | POA: Diagnosis not present

## 2019-07-11 HISTORY — DX: Homocystinuria: E72.11

## 2019-07-11 LAB — COMPREHENSIVE METABOLIC PANEL
ALT: 21 U/L (ref 0–44)
AST: 23 U/L (ref 15–41)
Albumin: 4.1 g/dL (ref 3.5–5.0)
Alkaline Phosphatase: 68 U/L (ref 38–126)
Anion gap: 11 (ref 5–15)
BUN: 13 mg/dL (ref 6–20)
CO2: 21 mmol/L — ABNORMAL LOW (ref 22–32)
Calcium: 9.6 mg/dL (ref 8.9–10.3)
Chloride: 105 mmol/L (ref 98–111)
Creatinine, Ser: 0.61 mg/dL (ref 0.44–1.00)
GFR calc Af Amer: >60 mL/min (ref >60)
GFR calc non Af Amer: >60 mL/min (ref >60)
Glucose, Bld: 92 mg/dL (ref 70–99)
Potassium: 3.8 mmol/L (ref 3.5–5.1)
Sodium: 137 mmol/L (ref 135–145)
Total Bilirubin: 0.3 mg/dL (ref 0.3–1.2)
Total Protein: 7.7 g/dL (ref 6.5–8.1)

## 2019-07-11 LAB — DIFFERENTIAL
Abs Immature Granulocytes: 0.03 10*3/uL (ref 0.00–0.07)
Basophils Absolute: 0 10*3/uL (ref 0.0–0.1)
Basophils Relative: 0 %
Eosinophils Absolute: 0.2 10*3/uL (ref 0.0–0.5)
Eosinophils Relative: 3 %
Immature Granulocytes: 0 %
Lymphocytes Relative: 34 %
Lymphs Abs: 2.6 10*3/uL (ref 0.7–4.0)
Monocytes Absolute: 0.6 10*3/uL (ref 0.1–1.0)
Monocytes Relative: 8 %
Neutro Abs: 4.2 10*3/uL (ref 1.7–7.7)
Neutrophils Relative %: 55 %

## 2019-07-11 LAB — I-STAT CHEM 8, ED
BUN: 15 mg/dL (ref 6–20)
Calcium, Ion: 1.24 mmol/L (ref 1.15–1.40)
Chloride: 105 mmol/L (ref 98–111)
Creatinine, Ser: 0.6 mg/dL (ref 0.44–1.00)
Glucose, Bld: 87 mg/dL (ref 70–99)
HCT: 41 % (ref 36.0–46.0)
Hemoglobin: 13.9 g/dL (ref 12.0–15.0)
Potassium: 3.7 mmol/L (ref 3.5–5.1)
Sodium: 138 mmol/L (ref 135–145)
TCO2: 21 mmol/L — ABNORMAL LOW (ref 22–32)

## 2019-07-11 LAB — PROTIME-INR
INR: 1 (ref 0.8–1.2)
Prothrombin Time: 13.5 seconds (ref 11.4–15.2)

## 2019-07-11 LAB — CBC
HCT: 40.8 % (ref 36.0–46.0)
Hemoglobin: 13 g/dL (ref 12.0–15.0)
MCH: 30.5 pg (ref 26.0–34.0)
MCHC: 31.9 g/dL (ref 30.0–36.0)
MCV: 95.8 fL (ref 80.0–100.0)
Platelets: 317 10*3/uL (ref 150–400)
RBC: 4.26 MIL/uL (ref 3.87–5.11)
RDW: 12.1 % (ref 11.5–15.5)
WBC: 7.6 10*3/uL (ref 4.0–10.5)
nRBC: 0 % (ref 0.0–0.2)

## 2019-07-11 LAB — I-STAT BETA HCG BLOOD, ED (MC, WL, AP ONLY): I-stat hCG, quantitative: <5 m[IU]/mL (ref <5)

## 2019-07-11 LAB — APTT: aPTT: 29 seconds (ref 24–36)

## 2019-07-11 LAB — CBG MONITORING, ED: Glucose-Capillary: 118 mg/dL — ABNORMAL HIGH (ref 70–99)

## 2019-07-11 MED ORDER — KETOROLAC TROMETHAMINE 15 MG/ML IJ SOLN
15.0000 mg | Freq: Once | INTRAMUSCULAR | Status: AC
  Administered 2019-07-11: 15 mg via INTRAVENOUS
  Filled 2019-07-11: qty 1

## 2019-07-11 MED ORDER — LACTATED RINGERS IV BOLUS
1000.0000 mL | Freq: Once | INTRAVENOUS | Status: AC
  Administered 2019-07-11: 1000 mL via INTRAVENOUS

## 2019-07-11 MED ORDER — MECLIZINE HCL 25 MG PO TABS
25.0000 mg | ORAL_TABLET | Freq: Once | ORAL | Status: AC
  Administered 2019-07-11: 25 mg via ORAL
  Filled 2019-07-11: qty 1

## 2019-07-11 NOTE — ED Notes (Signed)
Mother concerned about the patients headache, she wanted to know if she could get TPA. CT scan imaging has been completed with no acute signs. I have encouraged the patient to please stay and advised that it would be upon their discretion to leave.

## 2019-07-11 NOTE — ED Triage Notes (Addendum)
Per [facility], LSN yesterday at 1600, pt from work after having onset of right sided weakness(leg and arm) that began yesterday at 1600. Coworkers noticed pt coming into work walking slow this morning. Had visual changes yesterday, none today and weakness was better this morning but got worse at 1000. Also has ha, took tylenol at 1000. VSS. Obvious right arm/leg drift weakness. Has hx of hyperhomosisteinemia Axox4.

## 2019-07-11 NOTE — ED Provider Notes (Signed)
[facility] EMERGENCY DEPARTMENT Provider Note   CSN: [id number] Arrival date & time: 07/13/2019  1446     History   Chief Complaint Chief Complaint  Patient presents with  . Stroke sx  . Weakness    HPI Julie Anderson is a 23 y.o. female.     The history is provided by the patient and a parent.  Weakness Severity:  Severe Onset quality:  Gradual Duration:  6 hours Timing:  Constant Progression:  Worsening Chronicity:  New Context: not dehydration and not recent infection   Relieved by:  Nothing Ineffective treatments:  Lying down and rest Associated symptoms: dizziness and headaches   Associated symptoms: no abdominal pain, no cough, no fever, no nausea, no shortness of breath and no vomiting     Patient presents for right-sided weakness that began this afternoon overlapped a headache that began yesterday and ended earlier this evening.  The headache was described as behind her temples, left-sided, did not radiate down her neck.  Patient has had 1 other migraine before, but she states that this does feel different.  No photophobia, but she is sensitive to sound.  She currently does not have a headache.  States the right-sided weakness has not gotten worse but has not improved either and she is unable to ambulate due to it.  She did not take any medications prior to arrival, nothing seems to make symptoms better or worse.  Past Medical History:  Diagnosis Date  . ADD (attention deficit disorder)   . Anxiety   . Depression   . Hyperhomocysteinemia [facility])     Patient Active Problem List   Diagnosis Date Noted  . Seborrheic dermatitis, unspecified 05/17/2018  . Memory difficulties 11/28/2017  . Gender identity disorder 11/28/2017  . Cough 09/14/2016  . Hyperlipidemia 07/05/2016  . Vitamin D deficiency 07/05/2016  . Hyperglycemia 07/05/2016  . Hyperhomocysteinemia ([place]) 07/05/2016  . Hyperacusis 07/05/2016  . Lumbar degenerative disc disease  06/25/2015  . Myofascial pain syndrome 06/06/2015  . ADD (attention deficit disorder) 09/24/2013  . Anxiety and depression 07/12/2013  . Auditory hallucination 07/12/2013  . Allergic rhinitis 07/12/2013    Past Surgical History:  Procedure Laterality Date  . TYMPANOSTOMY TUBE PLACEMENT       OB History   No obstetric history on file.      Home Medications    Prior to Admission medications   Medication Sig Start Date End Date Taking? Authorizing Provider  acetaminophen (TYLENOL) 325 MG tablet Take 325 mg by mouth every 6 (six) hours as needed for mild pain.   Yes [provider]  Folic Acid-Vit Q000111Q 123456 (FOLBEE) 2.5-25-1 MG TABS tablet Take 1 tablet by mouth daily. 06/27/17  Yes Burns, Claudina Lick, MD  Multiple Vitamins-Minerals (MULTIVITAMIN WITH MINERALS) tablet Take 1 tablet by mouth daily.   Yes [provider]  Fluocinolone Acetonide (DERMOTIC) 0.01 % OIL Place 5 drops in ear(s) 2 (two) times daily as needed. Patient not taking: Reported on 07/20/2019 05/17/18   Binnie Rail, MD    Family History Family History  Adopted: Yes    Social History Social History   Tobacco Use  . Smoking status: Never Smoker  . Smokeless tobacco: Never Used  Substance Use Topics  . Alcohol use: No  . Drug use: No     Allergies   Cymbalta [duloxetine hcl]   Review of Systems Review of Systems  Constitutional: Negative for fever.  Respiratory: Negative for cough and shortness of  breath.   Gastrointestinal: Negative for abdominal pain, nausea and vomiting.  Skin: Negative for rash and wound.  Neurological: Positive for dizziness, weakness and headaches.  All other systems reviewed and are negative.    Physical Exam Updated Vital Signs BP 104/68 (BP Location: Left Arm)   Pulse 78   Temp 98.5 F (36.9 C) (Oral)   Resp 16   SpO2 98%   Physical Exam Vitals signs and nursing note reviewed.  Constitutional:      Appearance: She is well-developed.  HENT:      Head: Normocephalic and atraumatic.  Eyes:     Extraocular Movements: Extraocular movements intact.     Conjunctiva/sclera: Conjunctivae normal.  Neck:     Musculoskeletal: Neck supple.  Cardiovascular:     Rate and Rhythm: Normal rate and regular rhythm.     Heart sounds: No murmur.  Pulmonary:     Effort: Pulmonary effort is normal. No respiratory distress.     Breath sounds: Normal breath sounds. No wheezing.  Abdominal:     General: There is no distension.     Palpations: Abdomen is soft.     Tenderness: There is no abdominal tenderness. There is no guarding or rebound.  Musculoskeletal:     Right lower leg: No edema.     Left lower leg: No edema.  Skin:    General: Skin is warm and dry.  Neurological:     Mental Status: She is alert and oriented to person, place, and time.     Motor: Weakness present.     Comments: Right-sided weakness, no facial droop.  Patient slow to respond, no subjective numbness.  When arms are tugged, she does resist bilaterally equally.  She refuses to raise her right arm, right leg, squeeze her right hand.  5 out of 5 strength in left arm, left hand, left leg.      ED Treatments / Results  Labs (all labs ordered are listed, but only abnormal results are displayed) Labs Reviewed  COMPREHENSIVE METABOLIC PANEL - Abnormal; Notable for the following components:      Result Value   CO2 21 (*)    All other components within normal limits  I-STAT CHEM 8, ED - Abnormal; Notable for the following components:   TCO2 21 (*)    All other components within normal limits  CBG MONITORING, ED - Abnormal; Notable for the following components:   Glucose-Capillary 118 (*)    All other components within normal limits  PROTIME-INR  APTT  CBC  DIFFERENTIAL  TSH  I-STAT BETA HCG BLOOD, ED ([facility], [facility], [facility] ONLY)    EKG EKG Interpretation  Date/Time:  Wednesday July 11 2019 21:24:04 EST Ventricular Rate:  83 PR Interval:  154 QRS Duration: 93 QT  Interval:  382 QTC Calculation: 449 R Axis:   47 Text Interpretation: Sinus rhythm No significant change since last tracing Confirmed by Deno Etienne [phone]) on 07/24/2019 10:09:24 PM   Radiology Ct Head Wo Contrast  Result Date: 07/28/2019 CLINICAL DATA:  Possible stroke, right-sided weakness EXAM: CT HEAD WITHOUT CONTRAST TECHNIQUE: Contiguous axial images were obtained from the base of the skull through the vertex without intravenous contrast. COMPARISON:  12/22/2011 FINDINGS: Brain: No evidence of acute infarction, hemorrhage, hydrocephalus, extra-axial collection or mass lesion/mass effect. Vascular: No hyperdense vessel or unexpected calcification. Skull: Normal. Negative for fracture or focal lesion. Sinuses/Orbits: No acute finding. Other: None. IMPRESSION: No acute intracranial pathology. No CT evidence of acute stroke or hemorrhage. Electronically Signed  By: Eddie Candle M.D.   On: 07/29/2019 17:18    Procedures Procedures (including critical care time)  Medications Ordered in ED Medications  ketorolac (TORADOL) 15 MG/ML injection 15 mg (15 mg Intravenous Given 07/20/2019 2159)  meclizine (ANTIVERT) tablet 25 mg (25 mg Oral Given 07/23/2019 2156)  lactated ringers bolus 1,000 mL (1,000 mLs Intravenous New Bag/Given 07/20/2019 2158)     Initial Impression / Assessment and Plan / ED Course  I have reviewed the triage vital signs and the nursing notes.  Pertinent labs & imaging results that were available during my care of the patient were reviewed by me and considered in my medical decision making (see chart for details).        Julie Anderson is a 23 y.o. female with a past medical history of hyperhomocystinemia, anxiety, depression, ADD presents today for right-sided weakness that is overlapping with a migraine.  Presentation is consistent with a complex migraine.  Neurologic exam is difficult due to patient participation, but feel that that she since she is unable to even move her  right side at all even when taking off her shirt, labs and imaging should be ordered to evaluate for CVA, electrolyte abnormality, sepsis, complex migraine.  Migraine cocktail ordered as Toradol, meclizine, LR bolus considering patient currently does not have a headache.  Labs showed no acute abnormality.  CT scan showed no acute intracranial abnormality.  Considering the duration of symptoms and continued symptoms, MRI ordered.  MRI is pending at the time of handoff.  If MRI is negative, patient can go home with follow-up for likely complex migraine.  Care of patient was discussed with the supervising attending.  Final Clinical Impressions(s) / ED Diagnoses   Final diagnoses:  Weakness  Other migraine without status migrainosus, not intractable    ED Discharge Orders    None       Julianne Rice, MD 07/05/2019 [place], [place], DO 07/13/19 2301

## 2019-07-11 NOTE — Discharge Instructions (Addendum)
Please follow up with Dr. Quay Burow for recheck early next week. If symptoms return or if there is new concern, please return to the emergency department.

## 2019-07-11 NOTE — ED Provider Notes (Signed)
Here with "stroke symptoms" - right weakness, headache (no history). Weakness is not objectively appreciated on physician exam.   Given HA cocktail. Headache is better with cocktail.  Likely complicated migraine. This has been discussed with neurology without formal consultation.   Head CT negative. Mom at bedside with heightened concern because of unilateral weakness. MRI brain ordered to insure no positive results, however, is felt to be psychogenic vs migrainous.   1:00 - MRI still pending. Recheck and introduction to patient and mom finds her improved. Headache has resolved. She is moving arm and leg on the right side "better but not gone".   The patient has mild right grip weakness, almost full strength bicep/tricep movement.   She has ambulated and feels much better. Walks with minimal assistance. Mom and patient are comfortable with plan of discharge without MRI study and have community follow up for close recheck. Return precautions discussed.    Charlann Lange, PA-C 07/12/19 [place], [place], DO 07/13/19 2301

## 2019-07-12 DIAGNOSIS — R42 Dizziness and giddiness: Secondary | ICD-10-CM | POA: Diagnosis not present

## 2019-07-12 DIAGNOSIS — G43109 Migraine with aura, not intractable, without status migrainosus: Secondary | ICD-10-CM | POA: Diagnosis not present

## 2019-07-12 DIAGNOSIS — F909 Attention-deficit hyperactivity disorder, unspecified type: Secondary | ICD-10-CM | POA: Diagnosis not present

## 2019-07-12 DIAGNOSIS — R531 Weakness: Secondary | ICD-10-CM | POA: Diagnosis not present

## 2019-07-12 DIAGNOSIS — Z79899 Other long term (current) drug therapy: Secondary | ICD-10-CM | POA: Diagnosis not present

## 2019-07-12 LAB — TSH: TSH: 3.779 u[IU]/mL (ref 0.350–4.500)

## 2019-07-12 NOTE — ED Notes (Signed)
Pt was able to bear more weight on right leg. Ambulated pt around room with minimal assistance. Pt did very well.

## 2019-07-12 NOTE — ED Notes (Signed)
Patient verbalizes understanding of discharge instructions. Opportunity for questioning and answers were provided. Armband removed by staff, pt discharged from ED.  

## 2019-07-13 ENCOUNTER — Encounter: Payer: Self-pay | Admitting: Internal Medicine

## 2019-07-13 ENCOUNTER — Ambulatory Visit: Payer: 59 | Admitting: Internal Medicine

## 2019-07-18 NOTE — Progress Notes (Signed)
Subjective:    Patient ID: Julie Anderson, female    DOB: 08-Sep-1995, 23 y.o.   MRN: [id number]  HPI The patient is here for follow up from the ED.   She went to the ED with right sided weakness that started that day.  She had a headache that started the day prior and ended prior to going to the ED.  Her headache was left-sided behind her temple and did not radiate.  She states she did have some blurry vision.  She had sensitivity to sound and lightheadedness/dizziness.  She denied photophobia.  Her right sided weakness started 6 hrs prior and had not improved and she was unable to ambulate due to it.  She had no other concerning symptoms.  She had one migraine in the past that was different from this headache.  In the ED on exam she had no facial droop, no subjective numbness, she refused to raise her right arm, right leg and squeeze her right hand.  When arms tugged she resisted b/l equally.  Her strength was 5/5 on the left.    Blood work, EKG unremarkable.  CT head was unremarkable.  She was given IVF, toradol, meclizine. She felt better and was ambulating after medication.  MRI ordered, but since improved decided to d/c patient with outpatient follow up.    After leaving the ED she felt her right side was weak for a day or so and after that has felt normal.  Since being in the ED she gets intermittent lightheadedness.  She sits down and drinks a little water and is ok.  She feels 100% other than that.    She does have occasional lightheadedness/dizzines and thought it was related to not eating and drinking regularly.       Medications and allergies reviewed with patient and updated if appropriate.  Patient Active Problem List   Diagnosis Date Noted   Seborrheic dermatitis, unspecified 05/17/2018   Memory difficulties 11/28/2017   Gender identity disorder 11/28/2017   Cough 09/14/2016   Hyperlipidemia 07/05/2016   Vitamin D deficiency 07/05/2016   Hyperglycemia 07/05/2016    Hyperhomocysteinemia ([place]) 07/05/2016   Hyperacusis 07/05/2016   Lumbar degenerative disc disease 06/25/2015   Myofascial pain syndrome 06/06/2015   ADD (attention deficit disorder) 09/24/2013   Anxiety and depression 07/12/2013   Auditory hallucination 07/12/2013   Allergic rhinitis 07/12/2013    Current Outpatient Medications on File Prior to Visit  Medication Sig Dispense Refill   acetaminophen (TYLENOL) 325 MG tablet Take 325 mg by mouth every 6 (six) hours as needed for mild pain.     Folic Acid-Vit Q000111Q 123456 (FOLBEE) 2.5-25-1 MG TABS tablet Take 1 tablet by mouth daily. 90 tablet 3   Multiple Vitamins-Minerals (MULTIVITAMIN WITH MINERALS) tablet Take 1 tablet by mouth daily.     Fluocinolone Acetonide (DERMOTIC) 0.01 % OIL Place 5 drops in ear(s) 2 (two) times daily as needed. (Patient not taking: Reported on 07/02/2019) 20 mL 0   No current facility-administered medications on file prior to visit.     Past Medical History:  Diagnosis Date   ADD (attention deficit disorder)    Anxiety    Depression    Hyperhomocysteinemia ([facility])     Past Surgical History:  Procedure Laterality Date   TYMPANOSTOMY TUBE PLACEMENT      Social History   Socioeconomic History   Marital status: Single    Spouse name: Not on file   Number of children: Not on file  Years of education: Not on file   Highest education level: Not on file  Occupational History   Not on file  Social Needs   Financial resource strain: Not on file   Food insecurity    Worry: Not on file    Inability: Not on file   Transportation needs    Medical: Not on file    Non-medical: Not on file  Tobacco Use   Smoking status: Never Smoker   Smokeless tobacco: Never Used  Substance and Sexual Activity   Alcohol use: No   Drug use: No   Sexual activity: Not on file  Lifestyle   Physical activity    Days per week: Not on file    Minutes per session: Not on file   Stress: Not on  file  Relationships   Social connections    Talks on phone: Not on file    Gets together: Not on file    Attends religious service: Not on file    Active member of club or organization: Not on file    Attends meetings of clubs or organizations: Not on file    Relationship status: Not on file  Other Topics Concern   Not on file  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    Family History  Adopted: Yes    Review of Systems  Constitutional: Negative for chills and fever.  HENT: Negative for trouble swallowing.   Eyes: Positive for visual disturbance (blurriness with most recent episode).  Respiratory: Negative for cough, shortness of breath and wheezing.   Cardiovascular: Negative for chest pain and palpitations.  Gastrointestinal: Positive for nausea.  Genitourinary:       Occ random urinary leakage  Neurological: Positive for dizziness, speech difficulty (slurred speach during recent episode), weakness (right side with this episode), light-headedness, numbness (right side of body and face during episode) and headaches.       Objective:   Vitals:   07/19/19 1027  BP: 102/68  Pulse: 94  Temp: 98.9 F (37.2 C)  SpO2: 96%   BP Readings from Last 3 Encounters:  07/19/19 102/68  07/12/19 (!) 97/53  05/27/18 121/76   Wt Readings from Last 3 Encounters:  07/19/19 135 lb 3.2 oz (61.3 kg)  05/27/18 130 lb (59 kg)  05/17/18 135 lb (61.2 kg)   Body mass index is 27.31 kg/m.   Physical Exam    Constitutional: Appears well-developed and well-nourished. No distress.  HENT:  Head: Normocephalic and atraumatic.  Neck: Neck supple. No tracheal deviation present. No thyromegaly present.  No cervical lymphadenopathy Cardiovascular: Normal rate, regular rhythm and normal heart sounds.  No murmur heard. No carotid bruit .  No edema Pulmonary/Chest: Effort normal and breath sounds normal. No respiratory distress. No has no wheezes. No rales.    Neurological:  CN II-XII intact, gait normal,  Normal strength and sensation all extremities Skin: Skin is warm and dry. Not diaphoretic.  Psychiatric: Normal mood and affect. Behavior is normal.   Lab Results  Component Value Date   WBC 7.6 07/14/2019   HGB 13.9 07/16/2019   HCT 41.0 07/26/2019   PLT 317 07/02/2019   GLUCOSE 87 07/29/2019   CHOL 304 (H) 11/28/2017   TRIG 153.0 (H) 11/28/2017   HDL 56.20 11/28/2017   LDLCALC 217 (H) 11/28/2017   ALT 21 07/12/2019   AST 23 07/18/2019   NA 138 07/14/2019   K 3.7 07/01/2019   CL 105 07/14/2019  CREATININE 0.60 07/02/2019   BUN 15 07/09/2019   CO2 21 (L) 07/02/2019   TSH 3.779 07/12/2019   INR 1.0 07/16/2019   HGBA1C 5.7 11/28/2017   CT HEAD WO CONTRAST CLINICAL DATA:  Possible stroke, right-sided weakness  EXAM: CT HEAD WITHOUT CONTRAST  TECHNIQUE: Contiguous axial images were obtained from the base of the skull through the vertex without intravenous contrast.  COMPARISON:  12/22/2011  FINDINGS: Brain: No evidence of acute infarction, hemorrhage, hydrocephalus, extra-axial collection or mass lesion/mass effect.  Vascular: No hyperdense vessel or unexpected calcification.  Skull: Normal. Negative for fracture or focal lesion.  Sinuses/Orbits: No acute finding.  Other: None.  IMPRESSION: No acute intracranial pathology. No CT evidence of acute stroke or hemorrhage.  Electronically Signed   By: Eddie Candle M.D.   On: 07/18/2019 17:18    Assessment & Plan:    See Problem List for Assessment and Plan of chronic medical problems.     This visit occurred during the SARS-CoV-2 public health emergency.  Safety protocols were in place, including screening questions prior to the visit, additional usage of staff PPE, and extensive cleaning of exam room while observing appropriate contact time as indicated for disinfecting solutions.

## 2019-07-19 ENCOUNTER — Encounter: Payer: Self-pay | Admitting: Internal Medicine

## 2019-07-19 ENCOUNTER — Ambulatory Visit (INDEPENDENT_AMBULATORY_CARE_PROVIDER_SITE_OTHER): Payer: 59 | Admitting: Internal Medicine

## 2019-07-19 ENCOUNTER — Other Ambulatory Visit: Payer: Self-pay

## 2019-07-19 VITALS — BP 102/68 | HR 94 | Temp 98.9°F | Ht 59.0 in | Wt 135.2 lb

## 2019-07-19 DIAGNOSIS — G43909 Migraine, unspecified, not intractable, without status migrainosus: Secondary | ICD-10-CM | POA: Insufficient documentation

## 2019-07-19 DIAGNOSIS — R531 Weakness: Secondary | ICD-10-CM

## 2019-07-19 NOTE — Assessment & Plan Note (Signed)
Episode of right sided weakness associated with headache, lightheadedness/dizziness Evaluation in ED neg with basic labs, EKG, CT head Not able/refused to move right side of body Symptoms improved with IVF, toradol and meclizine ? complicated migraine vs migraine with psychogenic component Will refer to neuro Will hold off on MRI until she see neuro Advised advil or excedrin migraine if she has what she thinks may be a migraine/headache Stressed eating regularly and staying hydrated

## 2019-07-19 NOTE — Patient Instructions (Addendum)
  Medications reviewed and updated.  Changes include :  none    A referral was ordered for neurology - they will call you to schedule this.

## 2019-07-31 DIAGNOSIS — 419620001 Death: Secondary | SNOMED CT | POA: Diagnosis not present

## 2019-07-31 DEATH — deceased

## 2019-09-14 NOTE — Progress Notes (Signed)
Patient did not respond to links sent to begin visit.

## 2019-09-17 ENCOUNTER — Encounter: Payer: Self-pay | Admitting: Neurology

## 2019-09-17 ENCOUNTER — Telehealth (INDEPENDENT_AMBULATORY_CARE_PROVIDER_SITE_OTHER): Payer: 59 | Admitting: Neurology

## 2019-09-17 ENCOUNTER — Other Ambulatory Visit: Payer: Self-pay

## 2019-10-15 ENCOUNTER — Other Ambulatory Visit: Payer: Self-pay | Admitting: Internal Medicine

## 2019-11-05 ENCOUNTER — Other Ambulatory Visit: Payer: Self-pay | Admitting: Internal Medicine

## 2019-11-06 MED FILL — FOLBEE TABLET: 2.5-25-1 | 90 days supply | Qty: 90 | Fill #0

## 2019-11-09 DIAGNOSIS — Z20828 Contact with and (suspected) exposure to other viral communicable diseases: Secondary | ICD-10-CM | POA: Diagnosis not present

## 2019-11-23 MED FILL — FOLBEE TABLET: 2.5-25-1 | 90 days supply | Qty: 90 | Fill #0

## 2019-12-03 ENCOUNTER — Telehealth (INDEPENDENT_AMBULATORY_CARE_PROVIDER_SITE_OTHER): Payer: 59 | Admitting: Neurology

## 2019-12-03 DIAGNOSIS — F419 Anxiety disorder, unspecified: Secondary | ICD-10-CM | POA: Diagnosis not present

## 2019-12-03 DIAGNOSIS — E7211 Homocystinuria: Secondary | ICD-10-CM

## 2019-12-03 DIAGNOSIS — F32A Depression, unspecified: Secondary | ICD-10-CM

## 2019-12-03 DIAGNOSIS — F331 Major depressive disorder, recurrent, moderate: Secondary | ICD-10-CM | POA: Diagnosis not present

## 2019-12-03 DIAGNOSIS — F329 Major depressive disorder, single episode, unspecified: Secondary | ICD-10-CM | POA: Diagnosis not present

## 2019-12-03 DIAGNOSIS — R531 Weakness: Secondary | ICD-10-CM

## 2019-12-03 NOTE — Progress Notes (Signed)
Virtual Visit via Video Note The purpose of this virtual visit is to provide medical care while limiting exposure to the novel coronavirus.    Consent was obtained for video visit:  Yes.   Answered questions that patient had about telehealth interaction:  Yes.   I discussed the limitations, risks, security and privacy concerns of performing an evaluation and management service by telemedicine. I also discussed with the patient that there may be a patient responsible charge related to this service. The patient expressed understanding and agreed to proceed.  Pt location: Home Physician Location: office Name of referring provider:  Binnie Rail, MD I connected with Julie Anderson at patients initiation/request on 12/03/2019 at  7:50 AM EDT by video enabled telemedicine application and verified that I am speaking with the correct person using two identifiers. Pt MRN:  [id number] Pt DOB:  Jul 23, 1996 Video Participants:  Julie Anderson; mother   History of Present Illness:  Julie Anderson is a 24 year old  Female with hyperhomocysteinemia and anxiety with history of panic attacks who presents for migraine.  History supplemented by ED and referring provider notes.  On 07/10/2019, she developed a left-sided headache.  The following day, she developed right sided numbness and weakness of arm and leg but not face. No speech disturbance, visual disturbance, nausea, vomiting, photophobia or phonophobia.  This started at work where she was in a stressful situation.   She presented to the ED where CT of head was unremarkable.  She was treated with IVF, toradol and meclizine.  MRI of brain was ordered, but then cancelled because she improved.  Following ED visit, right sided weakness continued for about a day and then resolved.  She has never had a similar episode in the past.  She has not had a recurrence.  She reports a history of prior classic migraine.  She also has history of panic attacks presenting as  inability to move her entire body and shortness of breath.  Current NSAIDS:  none Current analgesics:  Tylenol Current triptans:  none Current ergotamine:  none Current anti-emetic:  none Current muscle relaxants:  none Current anti-anxiolytic:  none Current sleep aide:  none Current Antihypertensive medications:  none Current Antidepressant medications:  none Current Anticonvulsant medications:  none Current anti-CGRP:  none Current Vitamins/Herbal/Supplements:  MVI, folic [id number] Current Antihistamines/Decongestants:  none Other therapy:  none Hormone/birth control:  none Other medications:  none  Past abortive triptans:  none Past abortive ergotamine:  none Past muscle relaxants:  Flexeril Past anti-emetic:  none Past antihypertensive medications:  none Past antidepressant medications:  Cymbalta, Lexapro Past anticonvulsant medications:  Lyrica Past anti-CGRP:  none Past vitamins/Herbal/Supplements:  none Past antihistamines/decongestants:  Claritin Other past therapies:  none  Family history:  Unknown as she is adopted.  Past Medical History: Past Medical History:  Diagnosis Date  . ADD (attention deficit disorder)   . Anxiety   . Depression   . Hyperhomocysteinemia (HCC)     Medications: Outpatient Encounter Medications as of 12/03/2019  Medication Sig  . Folic Acid-Vit [id number] (FOLBEE) 2.5-25-1 MG TABS tablet Take 1 tablet by mouth daily. Follow-up appt is due in must see provider for future refills  . [DISCONTINUED] acetaminophen (TYLENOL) 325 MG tablet Take 325 mg by mouth every 6 (six) hours as needed for mild pain.  . [DISCONTINUED] Multiple Vitamins-Minerals (MULTIVITAMIN WITH MINERALS) tablet Take 1 tablet by mouth daily.   No facility-administered encounter medications on file as of 12/03/2019.  Allergies: Allergies  Allergen Reactions  . Cymbalta [Duloxetine Hcl]     Nausea, disorientation    Family History: Family History  Adopted: Yes     Social History: Social History   Socioeconomic History  . Marital status: Single    Spouse name: Not on file  . Number of children: Not on file  . Years of education: Not on file  . Highest education level: Not on file  Occupational History  . Not on file  Tobacco Use  . Smoking status: Never Smoker  . Smokeless tobacco: Never Used  Substance and Sexual Activity  . Alcohol use: Yes  . Drug use: No  . Sexual activity: Not on file  Other Topics Concern  . 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 Difficulty of Paying Living Expenses:   Food Insecurity:   . Worried About Charity fundraiser in the Last Year:   . Arboriculturist in the Last Year:   Transportation Needs:   . Film/video editor (Medical):   Marland Kitchen Lack of Transportation (Non-Medical):   Physical Activity:   . Days of Exercise per Week:   . Minutes of Exercise per Session:   Stress:   . Feeling of Stress :   Social Connections:   . Frequency of Communication with Friends and Family:   . Frequency of Social Gatherings with Friends and Family:   . Attends Religious Services:   . Active Member of Clubs or Organizations:   . Attends Archivist Meetings:   Marland Kitchen Marital Status:   Intimate Partner Violence:   . Fear of Current or Ex-Partner:   . Emotionally Abused:   Marland Kitchen Physically Abused:   . Sexually Abused:     Observations/Objective:   There were no vitals taken for this visit. No acute distress.  Alert and oriented.  Speech fluent and not dysarthric.  Language intact.  Eyes orthophoric on primary gaze.  Face symmetric.  Assessment and Plan:   Transient right sided weakness and numbness.  Given associated headache, hemiplegic migraine is definitely possible.  Conversion due to anxiety possible as well, but she never head a panic attack with unilateral presentation before.  However, these are  diagnoses of exclusion and other possible etiologies such as stroke must be ruled out (she does have hyperhomocysteinemia which may increase risk for stroke).  1.  MRI of brain and MRA of head and neck for evaluation of right sided weakness.  Further recommendations pending results.    Follow Up Instructions:    -I discussed the assessment and treatment plan with the patient. The patient was provided an opportunity to ask questions and all were answered. The patient agreed with the plan and demonstrated an understanding of the instructions.   The patient was advised to call back or seek an in-person evaluation if the symptoms worsen or if the condition fails to improve as anticipated.    Dudley Major, DO

## 2019-12-03 NOTE — Addendum Note (Signed)
Addended by: Venetia Night on: 12/03/2019 08:38 AM   Modules accepted: Orders

## 2019-12-13 DIAGNOSIS — F411 Generalized anxiety disorder: Secondary | ICD-10-CM | POA: Diagnosis not present

## 2019-12-13 DIAGNOSIS — F331 Major depressive disorder, recurrent, moderate: Secondary | ICD-10-CM | POA: Diagnosis not present

## 2019-12-29 ENCOUNTER — Ambulatory Visit
Admission: RE | Admit: 2019-12-29 | Discharge: 2019-12-29 | Disposition: A | Discharge status: Home / Self Care | Payer: 59 | Source: Ambulatory Visit | Attending: Neurology | Admitting: Neurology

## 2019-12-29 ENCOUNTER — Other Ambulatory Visit: Payer: Self-pay

## 2019-12-29 DIAGNOSIS — R531 Weakness: Secondary | ICD-10-CM | POA: Diagnosis not present

## 2019-12-29 MED ORDER — GADOBENATE DIMEGLUMINE 529 MG/ML IV SOLN: 14.0000 mL | Freq: Once | INTRAVENOUS | Status: DC | PRN

## 2019-12-29 MED ORDER — GADOBENATE DIMEGLUMINE 529 MG/ML IV SOLN
12.0000 mL | Freq: Once | INTRAVENOUS | Status: AC | PRN
  Administered 2019-12-29: 12 mL via INTRAVENOUS

## 2019-12-29 MED ORDER — GADOPENTETATE DIMEGLUMINE 469.01 MG/ML IV SOLN: 12.0000 mL | Freq: Once | INTRAVENOUS | Status: DC | PRN

## 2019-12-31 ENCOUNTER — Telehealth: Payer: Self-pay

## 2019-12-31 NOTE — Telephone Encounter (Signed)
Advised pt of mri results.

## 2019-12-31 NOTE — Telephone Encounter (Signed)
-----   Message from Pieter Partridge, DO sent at 12/31/2019  7:37 AM EDT ----- The MRIs are normal.  The episode of weakness may have been a migraine but it doesn't seem to be anything serious that caused it.  She may follow up as needed.

## 2020-04-29 ENCOUNTER — Other Ambulatory Visit: Payer: Self-pay

## 2020-04-29 ENCOUNTER — Emergency Department (HOSPITAL_BASED_OUTPATIENT_CLINIC_OR_DEPARTMENT_OTHER)
Admission: EM | Admit: 2020-04-29 | Discharge: 2020-04-30 | Disposition: A | Discharge status: Home / Self Care | Payer: 59 | Attending: Emergency Medicine | Admitting: Emergency Medicine

## 2020-04-29 ENCOUNTER — Encounter (HOSPITAL_BASED_OUTPATIENT_CLINIC_OR_DEPARTMENT_OTHER): Payer: Self-pay | Admitting: Emergency Medicine

## 2020-04-29 ENCOUNTER — Emergency Department (HOSPITAL_BASED_OUTPATIENT_CLINIC_OR_DEPARTMENT_OTHER): Payer: 59

## 2020-04-29 DIAGNOSIS — R0981 Nasal congestion: Secondary | ICD-10-CM | POA: Diagnosis present

## 2020-04-29 DIAGNOSIS — R509 Fever, unspecified: Secondary | ICD-10-CM | POA: Diagnosis not present

## 2020-04-29 DIAGNOSIS — B349 Viral infection, unspecified: Secondary | ICD-10-CM | POA: Insufficient documentation

## 2020-04-29 DIAGNOSIS — M791 Myalgia, unspecified site: Secondary | ICD-10-CM | POA: Diagnosis not present

## 2020-04-29 DIAGNOSIS — Z79899 Other long term (current) drug therapy: Secondary | ICD-10-CM | POA: Diagnosis not present

## 2020-04-29 DIAGNOSIS — Z20822 Contact with and (suspected) exposure to covid-19: Secondary | ICD-10-CM | POA: Insufficient documentation

## 2020-04-29 LAB — SARS CORONAVIRUS 2 BY RT PCR (HOSPITAL ORDER, PERFORMED IN ~~LOC~~ HOSPITAL LAB): SARS Coronavirus 2: NEGATIVE

## 2020-04-29 MED ORDER — ACETAMINOPHEN 325 MG PO TABS
650.0000 mg | ORAL_TABLET | Freq: Once | ORAL | Status: AC | PRN
  Administered 2020-04-29: 650 mg via ORAL
  Filled 2020-04-29: qty 2

## 2020-04-29 NOTE — ED Provider Notes (Signed)
[facility] EMERGENCY DEPARTMENT Provider Note   CSN: [id number] Arrival date & time: 04/29/20  1756     History Chief Complaint  Patient presents with  . Covid Exposure  . Headache    Julie Anderson is a 24 y.o. female.  The history is provided by the patient.  URI Presenting symptoms: congestion and fever   Severity:  Moderate Onset quality:  Gradual Duration:  1 day Progression:  Unchanged Chronicity:  New Relieved by:  Nothing Worsened by:  Nothing Ineffective treatments:  None tried Associated symptoms: myalgias   Associated symptoms: no neck pain   Risk factors: not elderly and no immunosuppression   Patient with URI symptoms.  Unclear if she was exposed to covid.  No neck pain or stiffness.  No weakness.  No n/v/d.       Past Medical History:  Diagnosis Date  . ADD (attention deficit disorder)   . Anxiety   . Depression   . Hyperhomocysteinemia [facility])     Patient Active Problem List   Diagnosis Date Noted  . Migraine without status migrainosus, not intractable 07/19/2019  . Right sided weakness 07/19/2019  . Seborrheic dermatitis, unspecified 05/17/2018  . Memory difficulties 11/28/2017  . Gender identity disorder 11/28/2017  . Cough 09/14/2016  . Hyperlipidemia 07/05/2016  . Vitamin D deficiency 07/05/2016  . Hyperglycemia 07/05/2016  . Hyperhomocysteinemia ([place]) 07/05/2016  . Hyperacusis 07/05/2016  . Lumbar degenerative disc disease 06/25/2015  . Myofascial pain syndrome 06/06/2015  . ADD (attention deficit disorder) 09/24/2013  . Anxiety and depression 07/12/2013  . Auditory hallucination 07/12/2013  . Allergic rhinitis 07/12/2013    Past Surgical History:  Procedure Laterality Date  . TYMPANOSTOMY TUBE PLACEMENT       OB History   No obstetric history on file.     Family History  Adopted: Yes    Social History   Tobacco Use  . Smoking status: Never Smoker  . Smokeless tobacco: Never Used  Substance Use Topics  .  Alcohol use: Yes  . Drug use: No    Home Medications Prior to Admission medications   Medication Sig Start Date End Date Taking? Authorizing Provider  Folic Acid-Vit X8-PJA S50 (FOLBEE) 2.5-25-1 MG TABS tablet Take 1 tablet by mouth daily. Follow-up appt is due in must see provider for future refills 11/06/19   Binnie Rail, MD    Allergies    Cymbalta [duloxetine hcl]  Review of Systems   Review of Systems  Constitutional: Positive for fever.  HENT: Positive for congestion.   Eyes: Negative for visual disturbance.  Respiratory: Negative for apnea.   Cardiovascular: Negative for chest pain.  Gastrointestinal: Negative for abdominal pain.  Genitourinary: Negative for difficulty urinating.  Musculoskeletal: Positive for myalgias. Negative for neck pain.  Skin: Negative for rash.  Neurological: Negative for dizziness.  Psychiatric/Behavioral: Negative for agitation.  All other systems reviewed and are negative.   Physical Exam Updated Vital Signs BP 98/69 (BP Location: Right Arm)   Pulse (!) 109   Temp 99.1 F (37.3 C) (Oral)   Resp 12   Wt 64 kg   SpO2 94%   BMI 28.50 kg/m   Physical Exam Vitals and nursing note reviewed.  Constitutional:      General: She is not in acute distress.    Appearance: Normal appearance.  HENT:     Head: Normocephalic and atraumatic.     Nose: Nose normal.  Eyes:     Conjunctiva/sclera: Conjunctivae normal.  Pupils: Pupils are equal, round, and reactive to light.  Cardiovascular:     Rate and Rhythm: Normal rate and regular rhythm.     Pulses: Normal pulses.     Heart sounds: Normal heart sounds.  Pulmonary:     Effort: Pulmonary effort is normal.     Breath sounds: Normal breath sounds.  Abdominal:     General: Abdomen is flat. Bowel sounds are normal.     Tenderness: There is no abdominal tenderness. There is no guarding or rebound.  Musculoskeletal:        General: Normal range of motion.     Cervical back: Normal range  of motion and neck supple.  Skin:    General: Skin is warm and dry.     Capillary Refill: Capillary refill takes less than 2 seconds.  Neurological:     General: No focal deficit present.     Mental Status: She is alert and oriented to person, place, and time.     Deep Tendon Reflexes: Reflexes normal.  Psychiatric:        Mood and Affect: Mood normal.        Behavior: Behavior normal.     ED Results / Procedures / Treatments   Labs (all labs ordered are listed, but only abnormal results are displayed) Labs Reviewed  SARS CORONAVIRUS 2 BY RT PCR (HOSPITAL ORDER, [place])    EKG None  Radiology No results found.  Procedures Procedures (including critical care time)  Medications Ordered in ED Medications  acetaminophen (TYLENOL) tablet 650 mg (650 mg Oral Given 04/29/20 1836)    ED Course  I have reviewed the triage vital signs and the nursing notes.  Pertinent labs & imaging results that were available during my care of the patient were reviewed by me and considered in my medical decision making (see chart for details).   Viral illness.  May be too early for the covid to be positive.  Have advised quarantine and recheck at [facility] or [facility] in 3 days.     Julie Anderson was evaluated in Emergency Department on 04/29/2020 for the symptoms described in the history of present illness. She was evaluated in the context of the global COVID-19 pandemic, which necessitated consideration that the patient might be at risk for infection with the SARS-CoV-2 virus that causes COVID-19. Institutional protocols and algorithms that pertain to the evaluation of patients at risk for COVID-19 are in a state of rapid change based on information released by regulatory bodies including the CDC and federal and state organizations. These policies and algorithms were followed during the patient's care in the ED.  Final Clinical Impression(s) / ED Diagnoses Final  diagnoses:  Viral illness     Return for intractable cough, coughing up blood,fevers >100.4 unrelieved by medication, shortness of breath, intractable vomiting, chest pain, shortness of breath, weakness,numbness, changes in speech, facial asymmetry,abdominal pain, passing out,Inability to tolerate liquids or food, cough, altered mental status or any concerns. No signs of systemic illness or infection. The patient is nontoxic-appearing on exam and vital signs are within normal limits.   I have reviewed the triage vital signs and the nursing notes. Pertinent labs &imaging results that were available during my care of the patient were reviewed by me and considered in my medical decision making (see chart for details).After history, exam, and    Jamiya Nims, MD 04/29/20 2345

## 2020-04-29 NOTE — ED Triage Notes (Signed)
Headache, body aches fever since last Tuesday. Feeling worse today. Fever 103 in triage, pt did not take any OTC medications today.

## 2020-08-13 ENCOUNTER — Emergency Department
Admission: RE | Admit: 2020-08-13 | Discharge: 2020-08-13 | Disposition: A | Discharge status: Home / Self Care | Payer: 59 | Source: Ambulatory Visit

## 2020-08-13 ENCOUNTER — Other Ambulatory Visit: Payer: Self-pay

## 2020-08-13 VITALS — BP 122/79 | HR 99 | Temp 98.8°F | Resp 18

## 2020-08-13 DIAGNOSIS — J069 Acute upper respiratory infection, unspecified: Secondary | ICD-10-CM | POA: Diagnosis not present

## 2020-08-13 DIAGNOSIS — R059 Cough, unspecified: Secondary | ICD-10-CM | POA: Diagnosis not present

## 2020-08-13 DIAGNOSIS — J029 Acute pharyngitis, unspecified: Secondary | ICD-10-CM

## 2020-08-13 MED ORDER — MAGIC MOUTHWASH W/LIDOCAINE: 10.0000 mL | ORAL | 0 refills | Status: DC | PRN

## 2020-08-13 MED ORDER — BENZONATATE 100 MG PO CAPS: 100.0000 mg | ORAL_CAPSULE | Freq: Three times a day (TID) | ORAL | 0 refills | Status: DC | PRN

## 2020-08-13 NOTE — ED Triage Notes (Signed)
Pt c/o sore throat x 3 days. Yesterday cough and bodyaches developed. Throat lozenges and motrin prn. No hx of strep. Sister recently sick. No known covid exposure. Has had covid vaccinations.

## 2020-08-13 NOTE — ED Provider Notes (Signed)
[facility]    CSN: [id number] Arrival date & time: 08/13/20  1700      History   Chief Complaint Chief Complaint  Patient presents with  . Sore Throat  . Cough    HPI Julie Anderson is a 24 y.o. female.   Established [facility] patient  Pt c/o sore throat x 3 days. Yesterday cough and bodyaches developed. Throat lozenges and motrin prn. No hx of strep. Sister recently sick. No known covid exposure. Has had covid vaccinations.   Patient is going to a wedding next week and wants to make sure she does not have either the flu or Covid since she will be seeing her grandmother at the wedding.  Patient works delivering goods to households.     Past Medical History:  Diagnosis Date  . ADD (attention deficit disorder)   . Anxiety   . Depression   . Hyperhomocysteinemia [facility])     Patient Active Problem List   Diagnosis Date Noted  . Migraine without status migrainosus, not intractable 07/19/2019  . Right sided weakness 07/19/2019  . Seborrheic dermatitis, unspecified 05/17/2018  . Memory difficulties 11/28/2017  . Gender identity disorder 11/28/2017  . Cough 09/14/2016  . Hyperlipidemia 07/05/2016  . Vitamin D deficiency 07/05/2016  . Hyperglycemia 07/05/2016  . Hyperhomocysteinemia ([place]) 07/05/2016  . Hyperacusis 07/05/2016  . Lumbar degenerative disc disease 06/25/2015  . Myofascial pain syndrome 06/06/2015  . ADD (attention deficit disorder) 09/24/2013  . Anxiety and depression 07/12/2013  . Auditory hallucination 07/12/2013  . Allergic rhinitis 07/12/2013    Past Surgical History:  Procedure Laterality Date  . TYMPANOSTOMY TUBE PLACEMENT      OB History   No obstetric history on file.      Home Medications    Prior to Admission medications   Medication Sig Start Date End Date Taking? Authorizing Provider  benzonatate (TESSALON) 100 MG capsule Take 1-2 capsules (100-200 mg total) by mouth 3 (three) times daily as needed for cough. 08/13/20    Robyn Haber, MD  Folic Acid-Vit D5-HGD J24 (FOLBEE) 2.5-25-1 MG TABS tablet Take 1 tablet by mouth daily. Follow-up appt is due in must see provider for future refills 11/06/19   Binnie Rail, MD  magic mouthwash w/lidocaine SOLN Take 10 mLs by mouth every 2 (two) hours as needed for mouth pain. Swish and spit out 08/13/20   Robyn Haber, MD    Family History Family History  Adopted: Yes    Social History Social History   Tobacco Use  . Smoking status: Never Smoker  . Smokeless tobacco: Never Used  Vaping Use  . Vaping Use: Never used  Substance Use Topics  . Alcohol use: Yes  . Drug use: No     Allergies   Cymbalta [duloxetine hcl]   Review of Systems Review of Systems  Constitutional: Negative.   HENT: Positive for sore throat.   Respiratory: Positive for cough.   Musculoskeletal: Positive for myalgias.     Physical Exam Triage Vital Signs ED Triage Vitals  Enc Vitals Group     BP 08/13/20 1717 122/79     Pulse Rate 08/13/20 1717 99     Resp 08/13/20 1717 18     Temp 08/13/20 1717 98.8 F (37.1 C)     Temp Source 08/13/20 1717 Oral     SpO2 08/13/20 1717 99 %     Weight --      Height --      Head Circumference --  Peak Flow --      Pain Score 08/13/20 1719 7     Pain Loc --      Pain Edu? --      Excl. in [place]? --    No data found.  Updated Vital Signs BP 122/79 (BP Location: Right Arm)   Pulse 99   Temp 98.8 F (37.1 C) (Oral)   Resp 18   SpO2 99%   Physical Exam Vitals and nursing note reviewed.  Constitutional:      Appearance: She is well-developed and normal weight.  HENT:     Head: Normocephalic.     Right Ear: Tympanic membrane normal.     Left Ear: Tympanic membrane normal.     Mouth/Throat:     Mouth: Mucous membranes are moist.     Pharynx: Posterior oropharyngeal erythema present. No oropharyngeal exudate or uvula swelling.     Tonsils: No tonsillar exudate.  Eyes:     Conjunctiva/sclera: Conjunctivae normal.   Cardiovascular:     Rate and Rhythm: Normal rate and regular rhythm.     Heart sounds: Normal heart sounds.  Pulmonary:     Effort: Pulmonary effort is normal.     Breath sounds: Normal breath sounds.  Musculoskeletal:     Cervical back: Normal range of motion and neck supple.  Lymphadenopathy:     Cervical: No cervical adenopathy.  Skin:    General: Skin is warm and dry.  Neurological:     General: No focal deficit present.     Mental Status: She is alert and oriented to person, place, and time.  Psychiatric:        Mood and Affect: Mood normal.        Behavior: Behavior normal.      UC Treatments / Results  Labs (all labs ordered are listed, but only abnormal results are displayed) Labs Reviewed  COVID-19, FLU A+B AND RSV  STREP A DNA PROBE  POCT RAPID STREP A (OFFICE)   Negative rapid strep in the office  Initial Impression / Assessment and Plan / UC Course  I have reviewed the triage vital signs and the nursing notes.  Pertinent labs & imaging results that were available during my care of the patient were reviewed by me and considered in my medical decision making (see chart for details).    Final Clinical Impressions(s) / UC Diagnoses   Final diagnoses:  Viral upper respiratory tract infection  Acute pharyngitis, unspecified etiology     Discharge Instructions     We are sending off a sample to be tested for flu and Covid.  The results should be back in 2 days and you can check for results on MyChart.    ED Prescriptions    Medication Sig Dispense Auth. Provider   benzonatate (TESSALON) 100 MG capsule Take 1-2 capsules (100-200 mg total) by mouth 3 (three) times daily as needed for cough. 40 capsule Robyn Haber, MD   magic mouthwash w/lidocaine SOLN Take 10 mLs by mouth every 2 (two) hours as needed for mouth pain. Swish and spit out 360 mL Robyn Haber, MD     I have reviewed the PDMP during this encounter.   Robyn Haber, MD 08/13/20  [phone]

## 2020-08-13 NOTE — Discharge Instructions (Addendum)
We are sending off a sample to be tested for flu and Covid.  The results should be back in 2 days and you can check for results on MyChart.

## 2020-08-14 LAB — STREP A DNA PROBE: Group A Strep Probe: NOT DETECTED

## 2020-08-15 LAB — COVID-19, FLU A+B AND RSV
Influenza A, NAA: NOT DETECTED
Influenza B, NAA: NOT DETECTED
RSV, NAA: NOT DETECTED
SARS-CoV-2, NAA: NOT DETECTED

## 2020-09-06 ENCOUNTER — Encounter: Payer: Self-pay | Admitting: Internal Medicine

## 2020-09-06 DIAGNOSIS — M5416 Radiculopathy, lumbar region: Secondary | ICD-10-CM

## 2020-09-07 ENCOUNTER — Other Ambulatory Visit: Payer: Self-pay

## 2020-09-07 ENCOUNTER — Emergency Department
Admission: EM | Admit: 2020-09-07 | Discharge: 2020-09-07 | Disposition: A | Discharge status: Home / Self Care | Payer: 59 | Source: Home / Self Care

## 2020-09-07 ENCOUNTER — Emergency Department (INDEPENDENT_AMBULATORY_CARE_PROVIDER_SITE_OTHER): Payer: 59

## 2020-09-07 DIAGNOSIS — R21 Rash and other nonspecific skin eruption: Secondary | ICD-10-CM | POA: Diagnosis not present

## 2020-09-07 DIAGNOSIS — R079 Chest pain, unspecified: Secondary | ICD-10-CM

## 2020-09-07 DIAGNOSIS — R1010 Upper abdominal pain, unspecified: Secondary | ICD-10-CM

## 2020-09-07 LAB — POCT CBC W AUTO DIFF (K'VILLE URGENT CARE)

## 2020-09-07 MED ORDER — OMEPRAZOLE 20 MG PO CPDR: 20.0000 mg | DELAYED_RELEASE_CAPSULE | Freq: Every day | ORAL | 0 refills | Status: DC

## 2020-09-07 NOTE — Discharge Instructions (Signed)
  You will be notified tomorrow if you have abnormal labs.   You may try the prescribed medication to help with symptoms in the meantime. Try to stick with a clear liquid diet or a bland diet to help limit pain.  Call to schedule a follow up with primary care later this week if not improving.   Call 911 or have someone drive you to the hospital if symptoms significantly worsening- including worsening pain, fever, vomiting, or other new concerning symptoms develop.

## 2020-09-07 NOTE — ED Triage Notes (Addendum)
Patient presents to Urgent Care with complaints of mid abdominal pain, intermittently since about a week ago. Patient reports the pain comes and goes, but is worse when she takes a deep breath and shortly after eating. Denies N/V/D.

## 2020-09-07 NOTE — ED Provider Notes (Signed)
[facility]    CSN: [id number] Arrival date & time: 09/07/20  1305      History   Chief Complaint Chief Complaint  Patient presents with  . Abdominal Pain    Mid    HPI Julie Anderson is a 25 y.o. female.   HPI Julie Anderson is a 25 y.o. female presenting to UC with c/o upper abdominal pain that is aching, cramping and sharp at times, usually after she eats.  Started about 1 week ago.  Denies pain at this time. Denies fever, chills, n/v/d. No urinary symptoms. She has not tried anything PTA. Mother questions if pt has a hiatal hernia. No hx of gallbladder issues.    Past Medical History:  Diagnosis Date  . ADD (attention deficit disorder)   . Anxiety   . Depression   . Hyperhomocysteinemia [facility])     Patient Active Problem List   Diagnosis Date Noted  . Migraine without status migrainosus, not intractable 07/19/2019  . Right sided weakness 07/19/2019  . Seborrheic dermatitis, unspecified 05/17/2018  . Memory difficulties 11/28/2017  . Gender identity disorder 11/28/2017  . Cough 09/14/2016  . Hyperlipidemia 07/05/2016  . Vitamin D deficiency 07/05/2016  . Hyperglycemia 07/05/2016  . Hyperhomocysteinemia ([place]) 07/05/2016  . Hyperacusis 07/05/2016  . Lumbar degenerative disc disease 06/25/2015  . Myofascial pain syndrome 06/06/2015  . ADD (attention deficit disorder) 09/24/2013  . Anxiety and depression 07/12/2013  . Auditory hallucination 07/12/2013  . Allergic rhinitis 07/12/2013    Past Surgical History:  Procedure Laterality Date  . TYMPANOSTOMY TUBE PLACEMENT      OB History   No obstetric history on file.      Home Medications    Prior to Admission medications   Medication Sig Start Date End Date Taking? Authorizing Provider  omeprazole (PRILOSEC) 20 MG capsule Take 1 capsule (20 mg total) by mouth daily. 09/07/20  Yes Adalaya Irion O, PA-C  benzonatate (TESSALON) 100 MG capsule Take 1-2 capsules (100-200 mg total) by mouth 3 (three) times  daily as needed for cough. 08/13/20   Robyn Haber, MD  Folic Acid-Vit W5-IOE V03 (FOLBEE) 2.5-25-1 MG TABS tablet Take 1 tablet by mouth daily. Follow-up appt is due in must see provider for future refills 11/06/19   Binnie Rail, MD  magic mouthwash w/lidocaine SOLN Take 10 mLs by mouth every 2 (two) hours as needed for mouth pain. Swish and spit out 08/13/20   Robyn Haber, MD    Family History Family History  Adopted: Yes    Social History Social History   Tobacco Use  . Smoking status: Never Smoker  . Smokeless tobacco: Never Used  Vaping Use  . Vaping Use: Never used  Substance Use Topics  . Alcohol use: Yes    Comment: socially  . Drug use: Yes    Types: Marijuana     Allergies   Cymbalta [duloxetine hcl]   Review of Systems Review of Systems  Constitutional: Negative for chills and fever.  HENT: Negative for congestion, ear pain, sore throat, trouble swallowing and voice change.   Respiratory: Negative for cough and shortness of breath.   Cardiovascular: Negative for chest pain and palpitations.  Gastrointestinal: Positive for abdominal pain. Negative for diarrhea, nausea and vomiting.  Musculoskeletal: Negative for arthralgias, back pain and myalgias.  Skin: Negative for rash.  All other systems reviewed and are negative.    Physical Exam Triage Vital Signs ED Triage Vitals  Enc Vitals Group  BP 09/07/20 1421 109/67     Pulse Rate 09/07/20 1421 (!) 102     Resp 09/07/20 1421 16     Temp 09/07/20 1421 98.3 F (36.8 C)     Temp Source 09/07/20 1421 Oral     SpO2 09/07/20 1421 100 %     Weight --      Height --      Head Circumference --      Peak Flow --      Pain Score 09/07/20 1419 0     Pain Loc --      Pain Edu? --      Excl. in [place]? --    No data found.  Updated Vital Signs BP 109/67 (BP Location: Right Arm)   Pulse (!) 102   Temp 98.3 F (36.8 C) (Oral)   Resp 16   SpO2 100%   Visual Acuity Right Eye Distance:   Left  Eye Distance:   Bilateral Distance:    Right Eye Near:   Left Eye Near:    Bilateral Near:     Physical Exam Vitals and nursing note reviewed.  Constitutional:      General: She is not in acute distress.    Appearance: She is well-developed and well-nourished. She is not ill-appearing, toxic-appearing or diaphoretic.  HENT:     Head: Normocephalic and atraumatic.  Eyes:     Extraocular Movements: EOM normal.  Cardiovascular:     Rate and Rhythm: Normal rate and regular rhythm.  Pulmonary:     Effort: Pulmonary effort is normal. No respiratory distress.     Breath sounds: Normal breath sounds.  Abdominal:     General: There is no distension.     Palpations: Abdomen is soft.     Tenderness: There is abdominal tenderness in the right upper quadrant, epigastric area and left upper quadrant. There is no right CVA tenderness, left CVA tenderness, guarding or rebound. Negative signs include Murphy's sign and McBurney's sign.  Musculoskeletal:        General: Normal range of motion.     Cervical back: Normal range of motion.  Skin:    General: Skin is warm and dry.  Neurological:     Mental Status: She is alert and oriented to person, place, and time.  Psychiatric:        Mood and Affect: Mood and affect normal.        Behavior: Behavior normal.      UC Treatments / Results  Labs (all labs ordered are listed, but only abnormal results are displayed) Labs Reviewed  COMPLETE METABOLIC PANEL WITH GFR  LIPASE  POCT CBC W AUTO DIFF ([place])    EKG   Radiology DG Chest 2 View  Result Date: 09/07/2020 CLINICAL DATA:  25 year old female with left-sided chest pain. EXAM: CHEST - 2 VIEW COMPARISON:  Chest radiograph dated 04/29/2020. FINDINGS: The heart size and mediastinal contours are within normal limits. Both lungs are clear. The visualized skeletal structures are unremarkable. IMPRESSION: No active cardiopulmonary disease. Electronically Signed   By: Anner Crete M.D.   On: 09/07/2020 15:03    Procedures Procedures (including critical care time)  Medications Ordered in UC Medications - No data to display  Initial Impression / Assessment and Plan / UC Course  I have reviewed the triage vital signs and the nursing notes.  Pertinent labs & imaging results that were available during my care of the patient were reviewed by me and considered  in my medical decision making (see chart for details).    CXR: no evidence of hiatal hernia Discussed with pt CBC: unremarkable CMP and lipase: pending Encouraged clear liquid to bland diet May try trial of omeprazole Encouraged f/u with PCP Discussed symptoms that warrant emergent care in the ED. AVS given  Final Clinical Impressions(s) / UC Diagnoses   Final diagnoses:  Upper abdominal pain     Discharge Instructions      You will be notified tomorrow if you have abnormal labs.   You may try the prescribed medication to help with symptoms in the meantime. Try to stick with a clear liquid diet or a bland diet to help limit pain.  Call to schedule a follow up with primary care later this week if not improving.   Call 911 or have someone drive you to the hospital if symptoms significantly worsening- including worsening pain, fever, vomiting, or other new concerning symptoms develop.      ED Prescriptions    Medication Sig Dispense Auth. Provider   omeprazole (PRILOSEC) 20 MG capsule Take 1 capsule (20 mg total) by mouth daily. 30 capsule Noe Gens, [place]     PDMP not reviewed this encounter.   Noe Gens, [place] 09/08/20 [phone]

## 2020-09-08 LAB — COMPLETE METABOLIC PANEL WITH GFR
AG Ratio: 1.5 (calc) (ref 1.0–2.5)
ALT: 140 U/L — ABNORMAL HIGH (ref 6–29)
AST: 226 U/L — ABNORMAL HIGH (ref 10–30)
Albumin: 4.6 g/dL (ref 3.6–5.1)
Alkaline phosphatase (APISO): 112 U/L (ref 31–125)
BUN: 22 mg/dL (ref 7–25)
CO2: 23 mmol/L (ref 20–32)
Calcium: 10.2 mg/dL (ref 8.6–10.2)
Chloride: 102 mmol/L (ref 98–110)
Creat: 0.69 mg/dL (ref 0.50–1.10)
GFR, Est African American: 142 mL/min/{1.73_m2} (ref >=60)
GFR, Est Non African American: 123 mL/min/{1.73_m2} (ref >=60)
Globulin: 3.1 g/dL (calc) (ref 1.9–3.7)
Glucose, Bld: 123 mg/dL — ABNORMAL HIGH (ref 65–99)
Potassium: 4.3 mmol/L (ref 3.5–5.3)
Sodium: 137 mmol/L (ref 135–146)
Total Bilirubin: 0.7 mg/dL (ref 0.2–1.2)
Total Protein: 7.7 g/dL (ref 6.1–8.1)

## 2020-09-08 LAB — LIPASE: Lipase: 70 U/L — ABNORMAL HIGH (ref 7–60)

## 2020-09-26 ENCOUNTER — Ambulatory Visit: Payer: 59 | Admitting: Orthopaedic Surgery

## 2020-09-26 ENCOUNTER — Encounter: Payer: Self-pay | Admitting: Orthopaedic Surgery

## 2020-09-26 ENCOUNTER — Ambulatory Visit: Payer: Self-pay

## 2020-09-26 VITALS — BP 121/77 | HR 99 | Ht 59.5 in | Wt 138.0 lb

## 2020-09-26 DIAGNOSIS — M545 Low back pain, unspecified: Secondary | ICD-10-CM

## 2020-09-26 DIAGNOSIS — M5459 Other low back pain: Secondary | ICD-10-CM | POA: Diagnosis not present
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
## 2020-09-30 ENCOUNTER — Other Ambulatory Visit: Payer: Self-pay

## 2020-09-30 NOTE — Progress Notes (Unsigned)
Subjective:    Patient ID: Julie Anderson, female    DOB: 28-Oct-1995, 25 y.o.   MRN: [id number]   This visit occurred during the SARS-CoV-2 public health emergency.  Safety protocols were in place, including screening questions prior to the visit, additional usage of staff PPE, and extensive cleaning of exam room while observing appropriate contact time as indicated for disinfecting solutions.    HPI She is here for a physical exam.   ED 1/9 for RUQ/epigastric region pain.  She had three episodes and it would last 1-2 hours. She has intense pain there whenever she tried to eat.  She was d/c'd prior to labs returning.  lfts elevated. Started on omeprazole. She took the omeprazole a few times but then she was fine.  No pain since then.  She denies any h/o reflux.      Medications and allergies reviewed with patient and updated if appropriate.  Patient Active Problem List   Diagnosis Date Noted  . Migraine without status migrainosus, not intractable 07/19/2019  . Right sided weakness 07/19/2019  . Seborrheic dermatitis, unspecified 05/17/2018  . Memory difficulties 11/28/2017  . Gender identity disorder 11/28/2017  . Cough 09/14/2016  . Hyperlipidemia 07/05/2016  . Vitamin D deficiency 07/05/2016  . Hyperglycemia 07/05/2016  . Hyperhomocysteinemia ([place]) 07/05/2016  . Hyperacusis 07/05/2016  . Lumbar degenerative disc disease 06/25/2015  . Myofascial pain syndrome 06/06/2015  . ADD (attention deficit disorder) 09/24/2013  . Anxiety and depression 07/12/2013  . Auditory hallucination 07/12/2013  . Allergic rhinitis 07/12/2013    Current Outpatient Medications on File Prior to Visit  Medication Sig Dispense Refill  . Folic Acid-Vit F6-EPP I95 (FOLBEE) 2.5-25-1 MG TABS tablet Take 1 tablet by mouth daily. Follow-up appt is due in must see provider for future refills 90 tablet 0   No current facility-administered medications on file prior to visit.    Past Medical History:   Diagnosis Date  . ADD (attention deficit disorder)   . Anxiety   . Depression   . Hyperhomocysteinemia ([place])     Past Surgical History:  Procedure Laterality Date  . TYMPANOSTOMY TUBE PLACEMENT      Social History   Socioeconomic History  . Marital status: Single    Spouse name: Not on file  . Number of children: Not on file  . Years of education: Not on file  . Highest education level: Not on file  Occupational History  . Not on file  Tobacco Use  . Smoking status: Never Smoker  . Smokeless tobacco: Never Used  Vaping Use  . Vaping Use: Never used  Substance and Sexual Activity  . Alcohol use: Yes    Comment: socially  . Drug use: Yes    Types: Marijuana  . Sexual activity: Not on file  Other Topics Concern  . Not on file  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   Social Determinants of Health   Financial Resource Strain: Not on file  Food Insecurity: Not on file  Transportation Needs: Not on file  Physical Activity: Not on file  Stress: Not on file  Social Connections: Not on file    Family History  Adopted: Yes    Review of Systems  Constitutional: Negative for chills and fever.  Eyes: Negative for visual disturbance.  Respiratory: Positive for cough (allergy related). Negative for shortness of breath and wheezing.   Cardiovascular: Positive for palpitations (with anxiety). Negative for chest pain and  leg swelling.  Gastrointestinal: Negative for abdominal pain, blood in stool, constipation, diarrhea and nausea.       No gerd  Genitourinary: Negative for dysuria and hematuria.  Musculoskeletal: Positive for arthralgias (elbows, fingers, wrists, knees, hips sometimes) and back pain (occ).  Skin: Positive for rash (keratosis piloris).  Neurological: Negative for light-headedness and headaches.  Psychiatric/Behavioral: Positive for decreased concentration and dysphoric mood (mangeable). The patient is nervous/anxious  (mangeable).        Objective:   Vitals:   10/01/20 1317  BP: 110/72  Pulse: 94  Temp: 98.6 F (37 C)  SpO2: 96%   Filed Weights   10/01/20 1317  Weight: 143 lb 9.6 oz (65.1 kg)   Body mass index is 28.52 kg/m.  BP Readings from Last 3 Encounters:  10/01/20 110/72  09/26/20 121/77  09/07/20 109/67    Wt Readings from Last 3 Encounters:  10/01/20 143 lb 9.6 oz (65.1 kg)  09/26/20 138 lb (62.6 kg)  04/29/20 141 lb 1.5 oz (64 kg)    Depression screen PHQ 2/9 10/01/2020  Decreased Interest 0  Down, Depressed, Hopeless 1  PHQ - 2 Score 1  Altered sleeping 3  Tired, decreased energy 1  Change in appetite 0  Feeling bad or failure about yourself  0  Trouble concentrating 1  Moving slowly or fidgety/restless 0  Suicidal thoughts 0  PHQ-9 Score 6  Difficult doing work/chores Somewhat difficult     Physical Exam Constitutional: She appears well-developed and well-nourished. No distress.  HENT:  Head: Normocephalic and atraumatic.  Right Ear: External ear normal. Normal ear canal and TM Left Ear: External ear normal.  Normal ear canal and TM Mouth/Throat: Oropharynx is clear and moist.  Eyes: Conjunctivae and EOM are normal.  Neck: Neck supple. No tracheal deviation present. No thyromegaly present.  No carotid bruit  Cardiovascular: Normal rate, regular rhythm and normal heart sounds.   No murmur heard.  No edema. Pulmonary/Chest: Effort normal and breath sounds normal. No respiratory distress. She has no wheezes. She has no rales.  Breast: deferred   Abdominal: Soft. She exhibits no distension. There is no tenderness.  Lymphadenopathy: She has no cervical adenopathy.  Skin: Skin is warm and dry. She is not diaphoretic.  Psychiatric: She has a normal mood and affect. Her behavior is normal.   Lab Results  Component Value Date   WBC 7.6 07/14/2019   HGB 13.9 07/08/2019   HCT 41.0 07/08/2019   PLT 317 07/16/2019   GLUCOSE 123 (H) 09/07/2020   CHOL 304 (H)  11/28/2017   TRIG 153.0 (H) 11/28/2017   HDL 56.20 11/28/2017   LDLCALC 217 (H) 11/28/2017   ALT 140 (H) 09/07/2020   AST 226 (H) 09/07/2020   NA 137 09/07/2020   K 4.3 09/07/2020   CL 102 09/07/2020   CREATININE 0.69 09/07/2020   BUN 22 09/07/2020   CO2 23 09/07/2020   TSH 3.779 07/12/2019   INR 1.0 07/03/2019   HGBA1C 5.7 11/28/2017        Assessment & Plan:   Physical exam: Screening blood work    ordered Immunizations   Deferred tdap Gyn  Not in past two year-encouraged follow-up with GYN Exercise  none-encourage regular exercise Weight  encouraged weight loss Substance abuse  Drink social, none   Depression screening using PHQ9 was positive for depression.  We reviewed this together.  Some of her positive answers were related to her ADD, which she would like to restart treatment for  since she is now in school.  She admits she does have some mild depression, but feels it is very manageable and does not want any medication for it.   See Problem List for Assessment and Plan of chronic medical problems.

## 2020-09-30 NOTE — Patient Instructions (Addendum)
Blood work was ordered.     No immunization administered today.   Medications changes include :   Start Adderall 20 mg daily in morning  Your prescription(s) have been submitted to your pharmacy.   An ultrasound of your liver was ordered.   Please followup in 6 months     Health Maintenance, Female Adopting a healthy lifestyle and getting preventive care are important in promoting health and wellness. Ask your health care provider about:  The right schedule for you to have regular tests and exams.  Things you can do on your own to prevent diseases and keep yourself healthy. What should I know about diet, weight, and exercise? Eat a healthy diet  Eat a diet that includes plenty of vegetables, fruits, low-fat dairy products, and lean protein.  Do not eat a lot of foods that are high in solid fats, added sugars, or sodium.   Maintain a healthy weight Body mass index (BMI) is used to identify weight problems. It estimates body fat based on height and weight. Your health care provider can help determine your BMI and help you achieve or maintain a healthy weight. Get regular exercise Get regular exercise. This is one of the most important things you can do for your health. Most adults should:  Exercise for at least 150 minutes each week. The exercise should increase your heart rate and make you sweat (moderate-intensity exercise).  Do strengthening exercises at least twice a week. This is in addition to the moderate-intensity exercise.  Spend less time sitting. Even light physical activity can be beneficial. Watch cholesterol and blood lipids Have your blood tested for lipids and cholesterol at 24 years of age, then have this test every 5 years. Have your cholesterol levels checked more often if:  Your lipid or cholesterol levels are high.  You are older than 24 years of age.  You are at high risk for heart disease. What should I know about cancer screening? Depending on  your health history and family history, you may need to have cancer screening at various ages. This may include screening for:  Breast cancer.  Cervical cancer.  Colorectal cancer.  Skin cancer.  Lung cancer. What should I know about heart disease, diabetes, and high blood pressure? Blood pressure and heart disease  High blood pressure causes heart disease and increases the risk of stroke. This is more likely to develop in people who have high blood pressure readings, are of African descent, or are overweight.  Have your blood pressure checked: ? Every 3-5 years if you are 30-42 years of age. ? Every year if you are 25 years old or older. Diabetes Have regular diabetes screenings. This checks your fasting blood sugar level. Have the screening done:  Once every three years after age 100 if you are at a normal weight and have a low risk for diabetes.  More often and at a younger age if you are overweight or have a high risk for diabetes. What should I know about preventing infection? Hepatitis B If you have a higher risk for hepatitis B, you should be screened for this virus. Talk with your health care provider to find out if you are at risk for hepatitis B infection. Hepatitis C Testing is recommended for:  Everyone born from [birth year] through 1964.  Anyone with known risk factors for hepatitis C. Sexually transmitted infections (STIs)  Get screened for STIs, including gonorrhea and chlamydia, if: ? You are sexually active and are younger than  24 years of age. ? You are older than 24 years of age and your health care provider tells you that you are at risk for this type of infection. ? Your sexual activity has changed since you were last screened, and you are at increased risk for chlamydia or gonorrhea. Ask your health care provider if you are at risk.  Ask your health care provider about whether you are at high risk for HIV. Your health care provider may recommend a prescription  medicine to help prevent HIV infection. If you choose to take medicine to prevent HIV, you should first get tested for HIV. You should then be tested every 3 months for as long as you are taking the medicine. Pregnancy  If you are about to stop having your period (premenopausal) and you may become pregnant, seek counseling before you get pregnant.  Take 400 to 800 micrograms (mcg) of folic acid every day if you become pregnant.  Ask for birth control (contraception) if you want to prevent pregnancy. Osteoporosis and menopause Osteoporosis is a disease in which the bones lose minerals and strength with aging. This can result in bone fractures. If you are 36 years old or older, or if you are at risk for osteoporosis and fractures, ask your health care provider if you should:  Be screened for bone loss.  Take a calcium or vitamin D supplement to lower your risk of fractures.  Be given hormone replacement therapy (HRT) to treat symptoms of menopause. Follow these instructions at home: Lifestyle  Do not use any products that contain nicotine or tobacco, such as cigarettes, e-cigarettes, and chewing tobacco. If you need help quitting, ask your health care provider.  Do not use street drugs.  Do not share needles.  Ask your health care provider for help if you need support or information about quitting drugs. Alcohol use  Do not drink alcohol if: ? Your health care provider tells you not to drink. ? You are pregnant, may be pregnant, or are planning to become pregnant.  If you drink alcohol: ? Limit how much you use to 0-1 drink a day. ? Limit intake if you are breastfeeding.  Be aware of how much alcohol is in your drink. In the [place] one drink equals one 12 oz bottle of beer (355 mL), one 5 oz glass of wine (148 mL), or one 1 oz glass of hard liquor (44 mL). General instructions  Schedule regular health, dental, and eye exams.  Stay current with your vaccines.  Tell your health  care provider if: ? You often feel depressed. ? You have ever been abused or do not feel safe at home. Summary  Adopting a healthy lifestyle and getting preventive care are important in promoting health and wellness.  Follow your health care provider's instructions about healthy diet, exercising, and getting tested or screened for diseases.  Follow your health care provider's instructions on monitoring your cholesterol and blood pressure. This information is not intended to replace advice given to you by your health care provider. Make sure you discuss any questions you have with your health care provider. Document Revised: 08/09/2018 Document Reviewed: 08/09/2018 Elsevier Patient Education  2021 [employer].

## 2020-10-01 ENCOUNTER — Ambulatory Visit (INDEPENDENT_AMBULATORY_CARE_PROVIDER_SITE_OTHER): Payer: 59 | Admitting: Internal Medicine

## 2020-10-01 ENCOUNTER — Encounter: Payer: Self-pay | Admitting: Internal Medicine

## 2020-10-01 VITALS — BP 110/72 | HR 94 | Temp 98.6°F | Ht 59.5 in | Wt 143.6 lb

## 2020-10-01 DIAGNOSIS — Z0000 Encounter for general adult medical examination without abnormal findings: Secondary | ICD-10-CM | POA: Diagnosis not present

## 2020-10-01 DIAGNOSIS — Z1159 Encounter for screening for other viral diseases: Secondary | ICD-10-CM

## 2020-10-01 DIAGNOSIS — E7849 Other hyperlipidemia: Secondary | ICD-10-CM | POA: Diagnosis not present

## 2020-10-01 DIAGNOSIS — Z114 Encounter for screening for human immunodeficiency virus [HIV]: Secondary | ICD-10-CM | POA: Diagnosis not present

## 2020-10-01 DIAGNOSIS — R739 Hyperglycemia, unspecified: Secondary | ICD-10-CM

## 2020-10-01 DIAGNOSIS — F988 Other specified behavioral and emotional disorders with onset usually occurring in childhood and adolescence: Secondary | ICD-10-CM

## 2020-10-01 DIAGNOSIS — R1013 Epigastric pain: Secondary | ICD-10-CM | POA: Diagnosis not present

## 2020-10-01 LAB — CBC WITH DIFFERENTIAL/PLATELET
Basophils Absolute: 0 10*3/uL (ref 0.0–0.1)
Basophils Relative: 0.3 % (ref 0.0–3.0)
Eosinophils Absolute: 0.4 10*3/uL (ref 0.0–0.7)
Eosinophils Relative: 6.2 % — ABNORMAL HIGH (ref 0.0–5.0)
HCT: 37.5 % (ref 36.0–46.0)
Hemoglobin: 12.8 g/dL (ref 12.0–15.0)
Lymphocytes Relative: 51.3 % — ABNORMAL HIGH (ref 12.0–46.0)
Lymphs Abs: 3 10*3/uL (ref 0.7–4.0)
MCHC: 34 g/dL (ref 30.0–36.0)
MCV: 89.6 fl (ref 78.0–100.0)
Monocytes Absolute: 0.4 10*3/uL (ref 0.1–1.0)
Monocytes Relative: 7.5 % (ref 3.0–12.0)
Neutro Abs: 2 10*3/uL (ref 1.4–7.7)
Neutrophils Relative %: 34.7 % — ABNORMAL LOW (ref 43.0–77.0)
Platelets: 312 10*3/uL (ref 150.0–400.0)
RBC: 4.18 Mil/uL (ref 3.87–5.11)
RDW: 12.9 % (ref 11.5–15.5)
WBC: 5.8 10*3/uL (ref 4.0–10.5)

## 2020-10-01 LAB — COMPREHENSIVE METABOLIC PANEL
ALT: 38 U/L — ABNORMAL HIGH (ref 0–35)
AST: 28 U/L (ref 0–37)
Albumin: 4.4 g/dL (ref 3.5–5.2)
Alkaline Phosphatase: 75 U/L (ref 39–117)
BUN: 15 mg/dL (ref 6–23)
CO2: 23 mEq/L (ref 19–32)
Calcium: 9.7 mg/dL (ref 8.4–10.5)
Chloride: 105 mEq/L (ref 96–112)
Creatinine, Ser: 0.63 mg/dL (ref 0.40–1.20)
GFR: 124.86 mL/min (ref >60.00)
Glucose, Bld: 91 mg/dL (ref 70–99)
Potassium: 3.9 mEq/L (ref 3.5–5.1)
Sodium: 136 mEq/L (ref 135–145)
Total Bilirubin: 0.5 mg/dL (ref 0.2–1.2)
Total Protein: 7.9 g/dL (ref 6.0–8.3)

## 2020-10-01 LAB — LIPID PANEL
Cholesterol: 312 mg/dL — ABNORMAL HIGH (ref 0–200)
HDL: 56.3 mg/dL (ref >39.00)
NonHDL: 255.49
Total CHOL/HDL Ratio: 6
Triglycerides: 251 mg/dL — ABNORMAL HIGH (ref 0.0–149.0)
VLDL: 50.2 mg/dL — ABNORMAL HIGH (ref 0.0–40.0)

## 2020-10-01 LAB — TSH: TSH: 1.36 u[IU]/mL (ref 0.35–4.50)

## 2020-10-01 LAB — HEMOGLOBIN A1C: Hgb A1c MFr Bld: 5.5 % (ref 4.6–6.5)

## 2020-10-01 LAB — LDL CHOLESTEROL, DIRECT: Direct LDL: 214 mg/dL

## 2020-10-01 MED ORDER — AMPHETAMINE-DEXTROAMPHETAMINE 20 MG PO TABS: 20.0000 mg | ORAL_TABLET | Freq: Every day | ORAL | 0 refills | Status: DC

## 2020-10-01 NOTE — Assessment & Plan Note (Addendum)
Chronic Check lipids, TSH, CMP Encourage regular exercise and healthy diet Has hyper homocystinemia Deferred seeing cardiology in the past to see if she should consider treatment

## 2020-10-01 NOTE — Assessment & Plan Note (Addendum)
Chronic Has not been on treatment for a few years Back in school and having difficulty focusing Has been on many different medications She would like to start medication again now-did not have any side effects in the past Start Adderall 20 mg q am

## 2020-10-01 NOTE — Assessment & Plan Note (Signed)
Chronic Check a1c Low sugar / carb diet Stressed regular exercise  

## 2020-10-01 NOTE — Assessment & Plan Note (Signed)
New problem She was in the emergency room last month for abdominal pain Had elevated LFTs No imaging was done at that time She was started on omeprazole and took that for a few days No pain since then Symptoms consistent with cholelithiasis, GERD much less likely CMP today Will order ultrasound to evaluate liver and gallbladder Advised her that she could have another episode if this is the gallbladder

## 2020-10-02 LAB — HEPATITIS C ANTIBODY
Hepatitis C Ab: NONREACTIVE
SIGNAL TO CUT-OFF: 0.01 (ref <1.00)

## 2020-10-02 LAB — HIV ANTIBODY (ROUTINE TESTING W REFLEX): HIV 1&2 Ab, 4th Generation: NONREACTIVE

## 2020-10-15 ENCOUNTER — Encounter: Payer: Self-pay | Admitting: Internal Medicine

## 2020-11-13 ENCOUNTER — Encounter: Payer: Self-pay | Admitting: Internal Medicine

## 2020-11-13 ENCOUNTER — Ambulatory Visit
Admission: RE | Admit: 2020-11-13 | Discharge: 2020-11-13 | Disposition: A | Discharge status: Home / Self Care | Payer: 59 | Source: Ambulatory Visit | Attending: Internal Medicine | Admitting: Internal Medicine

## 2020-11-13 DIAGNOSIS — R1011 Right upper quadrant pain: Secondary | ICD-10-CM | POA: Diagnosis not present

## 2020-11-13 DIAGNOSIS — K8020 Calculus of gallbladder without cholecystitis without obstruction: Secondary | ICD-10-CM | POA: Insufficient documentation

## 2020-11-13 DIAGNOSIS — R1013 Epigastric pain: Secondary | ICD-10-CM | POA: Diagnosis not present

## 2020-11-19 ENCOUNTER — Inpatient Hospital Stay (HOSPITAL_COMMUNITY)
Admission: EM | Admit: 2020-11-19 | Discharge: 2020-11-22 | DRG: 419 | Disposition: A | Discharge status: Home / Self Care | Payer: 59 | Attending: Internal Medicine | Admitting: Internal Medicine

## 2020-11-19 ENCOUNTER — Other Ambulatory Visit: Payer: Self-pay

## 2020-11-19 ENCOUNTER — Emergency Department (HOSPITAL_COMMUNITY): Payer: 59

## 2020-11-19 ENCOUNTER — Encounter (HOSPITAL_COMMUNITY): Payer: Self-pay

## 2020-11-19 DIAGNOSIS — Z793 Long term (current) use of hormonal contraceptives: Secondary | ICD-10-CM

## 2020-11-19 DIAGNOSIS — K859 Acute pancreatitis without necrosis or infection, unspecified: Secondary | ICD-10-CM | POA: Diagnosis not present

## 2020-11-19 DIAGNOSIS — R1011 Right upper quadrant pain: Secondary | ICD-10-CM | POA: Diagnosis not present

## 2020-11-19 DIAGNOSIS — Z48815 Encounter for surgical aftercare following surgery on the digestive system: Secondary | ICD-10-CM | POA: Diagnosis not present

## 2020-11-19 DIAGNOSIS — Z20822 Contact with and (suspected) exposure to covid-19: Secondary | ICD-10-CM | POA: Diagnosis present

## 2020-11-19 DIAGNOSIS — F32A Depression, unspecified: Secondary | ICD-10-CM | POA: Diagnosis present

## 2020-11-19 DIAGNOSIS — Z79899 Other long term (current) drug therapy: Secondary | ICD-10-CM | POA: Diagnosis not present

## 2020-11-19 DIAGNOSIS — M7918 Myalgia, other site: Secondary | ICD-10-CM | POA: Diagnosis not present

## 2020-11-19 DIAGNOSIS — Z888 Allergy status to other drugs, medicaments and biological substances status: Secondary | ICD-10-CM | POA: Diagnosis not present

## 2020-11-19 DIAGNOSIS — K760 Fatty (change of) liver, not elsewhere classified: Secondary | ICD-10-CM | POA: Diagnosis not present

## 2020-11-19 DIAGNOSIS — K810 Acute cholecystitis: Secondary | ICD-10-CM | POA: Diagnosis not present

## 2020-11-19 DIAGNOSIS — K8020 Calculus of gallbladder without cholecystitis without obstruction: Secondary | ICD-10-CM

## 2020-11-19 DIAGNOSIS — K8000 Calculus of gallbladder with acute cholecystitis without obstruction: Secondary | ICD-10-CM | POA: Diagnosis not present

## 2020-11-19 DIAGNOSIS — F649 Gender identity disorder, unspecified: Secondary | ICD-10-CM | POA: Diagnosis not present

## 2020-11-19 DIAGNOSIS — Z8719 Personal history of other diseases of the digestive system: Secondary | ICD-10-CM | POA: Diagnosis present

## 2020-11-19 DIAGNOSIS — F418 Other specified anxiety disorders: Secondary | ICD-10-CM | POA: Diagnosis not present

## 2020-11-19 DIAGNOSIS — K8010 Calculus of gallbladder with chronic cholecystitis without obstruction: Secondary | ICD-10-CM | POA: Diagnosis not present

## 2020-11-19 DIAGNOSIS — J309 Allergic rhinitis, unspecified: Secondary | ICD-10-CM | POA: Diagnosis present

## 2020-11-19 DIAGNOSIS — K851 Biliary acute pancreatitis without necrosis or infection: Secondary | ICD-10-CM | POA: Diagnosis not present

## 2020-11-19 DIAGNOSIS — E785 Hyperlipidemia, unspecified: Secondary | ICD-10-CM | POA: Diagnosis present

## 2020-11-19 DIAGNOSIS — F419 Anxiety disorder, unspecified: Secondary | ICD-10-CM | POA: Diagnosis present

## 2020-11-19 DIAGNOSIS — E559 Vitamin D deficiency, unspecified: Secondary | ICD-10-CM | POA: Diagnosis not present

## 2020-11-19 DIAGNOSIS — F909 Attention-deficit hyperactivity disorder, unspecified type: Secondary | ICD-10-CM | POA: Diagnosis not present

## 2020-11-19 LAB — CBC
HCT: 40.3 % (ref 36.0–46.0)
Hemoglobin: 13.4 g/dL (ref 12.0–15.0)
MCH: 30.7 pg (ref 26.0–34.0)
MCHC: 33.3 g/dL (ref 30.0–36.0)
MCV: 92.4 fL (ref 80.0–100.0)
Platelets: 331 10*3/uL (ref 150–400)
RBC: 4.36 MIL/uL (ref 3.87–5.11)
RDW: 12 % (ref 11.5–15.5)
WBC: 9.1 10*3/uL (ref 4.0–10.5)
nRBC: 0 % (ref 0.0–0.2)

## 2020-11-19 LAB — COMPREHENSIVE METABOLIC PANEL
ALT: 108 U/L — ABNORMAL HIGH (ref 0–44)
AST: 175 U/L — ABNORMAL HIGH (ref 15–41)
Albumin: 4.1 g/dL (ref 3.5–5.0)
Alkaline Phosphatase: 115 U/L (ref 38–126)
Anion gap: 11 (ref 5–15)
BUN: 16 mg/dL (ref 6–20)
CO2: 21 mmol/L — ABNORMAL LOW (ref 22–32)
Calcium: 9.6 mg/dL (ref 8.9–10.3)
Chloride: 110 mmol/L (ref 98–111)
Creatinine, Ser: 0.85 mg/dL (ref 0.44–1.00)
GFR, Estimated: >60 mL/min (ref >60)
Glucose, Bld: 123 mg/dL — ABNORMAL HIGH (ref 70–99)
Potassium: 4 mmol/L (ref 3.5–5.1)
Sodium: 142 mmol/L (ref 135–145)
Total Bilirubin: 1.2 mg/dL (ref 0.3–1.2)
Total Protein: 8 g/dL (ref 6.5–8.1)

## 2020-11-19 MED ORDER — SODIUM CHLORIDE 0.9 % IV BOLUS
1000.0000 mL | Freq: Once | INTRAVENOUS | Status: AC
  Administered 2020-11-20: 1000 mL via INTRAVENOUS

## 2020-11-19 MED ORDER — ONDANSETRON HCL 4 MG/2ML IJ SOLN
4.0000 mg | Freq: Once | INTRAMUSCULAR | Status: AC
  Administered 2020-11-20: 4 mg via INTRAVENOUS
  Filled 2020-11-19: qty 2

## 2020-11-19 MED ORDER — MORPHINE SULFATE (PF) 4 MG/ML IV SOLN
4.0000 mg | Freq: Once | INTRAVENOUS | Status: AC
  Administered 2020-11-20: 4 mg via INTRAVENOUS
  Filled 2020-11-19: qty 1

## 2020-11-19 MED ORDER — ALUM & MAG HYDROXIDE-SIMETH 200-200-20 MG/5ML PO SUSP
30.0000 mL | Freq: Once | ORAL | Status: AC
  Administered 2020-11-20: 30 mL via ORAL
  Filled 2020-11-19: qty 30

## 2020-11-19 NOTE — ED Provider Notes (Signed)
[facility] DEPT Provider Note   CSN: [id number] Arrival date & time: 11/19/20  2158     History Chief Complaint  Patient presents with  . Abdominal Pain    Julie Anderson is a 25 y.o. adult.  25 yo F with a chief complaints of epigastric and right upper quadrant abdominal pain.  Going on since about 6 PM.  Is actually improved somewhat significantly over the past 30 minutes or so.  Describes it as a feeling like a knife was being stabbed to the upper portion of the abdomen and pulled downwards.  Has had pain like this off and on in the past.  Seen her family doctor and had an ultrasound that showed gallstones but without acute cholecystitis.  Plan to follow-up with general surgery but has not yet made that appointment.  Denies nausea vomiting fevers or chills.  The history is provided by the patient.  Abdominal Pain Pain location:  Epigastric and RUQ Pain quality: sharp and shooting   Pain radiates to:  Does not radiate Pain severity:  Severe Onset quality:  Sudden Duration:  5 hours Timing:  Constant Progression:  Partially resolved Chronicity:  Recurrent Relieved by:  Nothing Worsened by:  Nothing Ineffective treatments:  None tried Associated symptoms: nausea   Associated symptoms: no chest pain, no chills, no diarrhea, no fever, no shortness of breath and no vomiting        Past Medical History:  Diagnosis Date  . ADD (attention deficit disorder)   . Anxiety   . Depression   . Hyperhomocysteinemia [facility])     Patient Active Problem List   Diagnosis Date Noted  . Cholelithiasis 11/13/2020  . Colicky epigastric pain 10/01/2020  . Migraine without status migrainosus, not intractable 07/19/2019  . Right sided weakness 07/19/2019  . Seborrheic dermatitis, unspecified 05/17/2018  . Memory difficulties 11/28/2017  . Gender identity disorder 11/28/2017  . Cough 09/14/2016  . Hyperlipidemia 07/05/2016  . Vitamin D deficiency 07/05/2016   . Hyperglycemia 07/05/2016  . Hyperhomocysteinemia ([place]) 07/05/2016  . Hyperacusis 07/05/2016  . Lumbar degenerative disc disease 06/25/2015  . Myofascial pain syndrome 06/06/2015  . ADD (attention deficit disorder) 09/24/2013  . Anxiety and depression 07/12/2013  . Auditory hallucination 07/12/2013  . Allergic rhinitis 07/12/2013    Past Surgical History:  Procedure Laterality Date  . TYMPANOSTOMY TUBE PLACEMENT       OB History   No obstetric history on file.     Family History  Adopted: Yes    Social History   Tobacco Use  . Smoking status: Never Smoker  . Smokeless tobacco: Never Used  Vaping Use  . Vaping Use: Never used  Substance Use Topics  . Alcohol use: Yes    Comment: socially  . Drug use: Yes    Types: Marijuana    Home Medications Prior to Admission medications   Medication Sig Start Date End Date Taking? Authorizing Provider  amphetamine-dextroamphetamine (ADDERALL) 20 MG tablet Take 1 tablet (20 mg total) by mouth daily. Patient taking differently: Take 20 mg by mouth daily as needed. 10/01/20  Yes Binnie Rail, MD  etonogestrel (NEXPLANON) 68 MG IMPL implant 1 each by Subdermal route once. 2016 placed   Yes [provider]  Folic Acid-Vit I9-JJO A41 (FOLBEE) 2.5-25-1 MG TABS tablet Take 1 tablet by mouth daily. Follow-up appt is due in must see provider for future refills 11/06/19  Yes Burns, Claudina Lick, MD    Allergies    Cymbalta [  duloxetine hcl]  Review of Systems   Review of Systems  Constitutional: Negative for chills and fever.  HENT: Negative for congestion and facial swelling.   Eyes: Negative for discharge and visual disturbance.  Respiratory: Negative for shortness of breath.   Cardiovascular: Negative for chest pain and palpitations.  Gastrointestinal: Positive for abdominal pain and nausea. Negative for diarrhea and vomiting.  Musculoskeletal: Negative for arthralgias and myalgias.  Skin: Negative for color change and rash.   Neurological: Negative for tremors, syncope and headaches.  Psychiatric/Behavioral: Negative for confusion and dysphoric mood.    Physical Exam Updated Vital Signs BP 109/87   Pulse 74   Temp 98.4 F (36.9 C) (Oral)   Resp 18   Ht 4' 11.5" (1.511 m)   Wt 63.5 kg   LMP 11/17/2020   SpO2 100%   BMI 27.80 kg/m   Physical Exam Vitals and nursing note reviewed.  Constitutional:      Appearance: Julie Anderson "Max" is well-developed.  HENT:     Head: Normocephalic and atraumatic.  Eyes:     Pupils: Pupils are equal, round, and reactive to light.  Neck:     Vascular: No JVD.  Cardiovascular:     Rate and Rhythm: Normal rate and regular rhythm.     Heart sounds: No murmur heard. No friction rub. No gallop.   Pulmonary:     Effort: No respiratory distress.     Breath sounds: No wheezing.  Abdominal:     General: There is no distension.     Tenderness: There is abdominal tenderness. There is no guarding or rebound.     Comments: Tenderness worse to the epigastrium and right upper quadrant.  Negative Murphy sign.  Musculoskeletal:        General: Normal range of motion.     Cervical back: Normal range of motion and neck supple.  Skin:    Coloration: Skin is not pale.     Findings: No rash.  Neurological:     Mental Status: Julie Anderson "Max" is alert and oriented to person, place, and time.  Psychiatric:        Behavior: Behavior normal.     ED Results / Procedures / Treatments   Labs (all labs ordered are listed, but only abnormal results are displayed) Labs Reviewed  LIPASE, BLOOD - Abnormal; Notable for the following components:      Result Value   Lipase 1,212 (*)    All other components within normal limits  COMPREHENSIVE METABOLIC PANEL - Abnormal; Notable for the following components:   CO2 21 (*)    Glucose, Bld 123 (*)    AST 175 (*)    ALT 108 (*)    All other components within normal limits  RESP PANEL BY RT-PCR (FLU A&B, COVID) ARPGX2  CBC  HCG,  QUANTITATIVE, PREGNANCY  URINALYSIS, ROUTINE W REFLEX MICROSCOPIC    EKG None  Radiology US Abdomen Limited RUQ (LIVER/GB)  Result Date: 11/19/2020 CLINICAL DATA:  Right upper quadrant pain EXAM: ULTRASOUND ABDOMEN LIMITED RIGHT UPPER QUADRANT COMPARISON:  Ultrasound 11/13/2020 FINDINGS: Gallbladder: Redemonstration of the tiny layering, shadowing gallstones within the gallbladder lumen, likely a mixture of biliary stones/biliary sand. No abnormal gallbladder wall thickening. No pericholecystic fluid. Sonographic Percell Miller sign is reportedly negative. Common bile duct: Diameter: 4 mm, nondilated Liver: Mildly increased hepatic echogenicity with loss of definition of the portal triads. No focal liver lesion. Portal vein is patent on color Doppler imaging with normal direction of blood  flow towards the liver. Other: None. IMPRESSION: 1. Cholelithiasis without sonographic evidence of acute cholecystitis. 2. Likely mild hepatic steatosis. Electronically Signed   By: Lovena Le M.D.   On: 11/19/2020 23:59    Procedures Procedures   Medications Ordered in ED Medications  morphine 4 MG/ML injection 4 mg (4 mg Intravenous Given 11/20/20 0010)  ondansetron (ZOFRAN) injection 4 mg (4 mg Intravenous Given 11/20/20 0011)  sodium chloride 0.9 % bolus 1,000 mL (0 mLs Intravenous Stopped 11/20/20 0117)  alum & mag hydroxide-simeth (MAALOX/MYLANTA) 200-200-20 MG/5ML suspension 30 mL (30 mLs Oral Given 11/20/20 0011)  morphine 4 MG/ML injection 4 mg (4 mg Intravenous Given 11/20/20 0132)    ED Course  I have reviewed the triage vital signs and the nursing notes.  Pertinent labs & imaging results that were available during my care of the patient were reviewed by me and considered in my medical decision making (see chart for details).    MDM Rules/Calculators/A&P                          25 yo F with a significant past medical history of gallstones comes with a chief complaint of epigastric and right upper  quadrant pain.  Pain is started to improve some more likely biliary colic than acute cholecystitis but with ongoing pain will obtain lab work treat pain and nausea right upper quadrant ultrasound.  Right upper quadrant ultrasound with cholelithiasis without acute cholecystitis.  Lipase is returned and is greater than 1200.  Patient still with ongoing discomfort.  Difficulty tolerating p.o. here in the ED.  Discussed with medicine for admission.  The patients results and plan were reviewed and discussed.   Any x-rays performed were independently reviewed by myself.   Differential diagnosis were considered with the presenting HPI.  Medications  morphine 4 MG/ML injection 4 mg (4 mg Intravenous Given 11/20/20 0010)  ondansetron (ZOFRAN) injection 4 mg (4 mg Intravenous Given 11/20/20 0011)  sodium chloride 0.9 % bolus 1,000 mL (0 mLs Intravenous Stopped 11/20/20 0117)  alum & mag hydroxide-simeth (MAALOX/MYLANTA) 200-200-20 MG/5ML suspension 30 mL (30 mLs Oral Given 11/20/20 0011)  morphine 4 MG/ML injection 4 mg (4 mg Intravenous Given 11/20/20 0132)    Vitals:   11/20/20 0018 11/20/20 0030 11/20/20 0100 11/20/20 0130  BP: 110/69 112/65 (!) 99/54 109/87  Pulse: 80 78 76 74  Resp: 16 18 18 18   Temp:      TempSrc:      SpO2: 100% 99% 97% 100%  Weight:      Height:        Final diagnoses:  RUQ abdominal pain  Acute biliary pancreatitis without infection or necrosis    Admission/ observation were discussed with the admitting physician, patient and/or family and they are comfortable with the plan.   Final Clinical Impression(s) / ED Diagnoses Final diagnoses:  RUQ abdominal pain  Acute biliary pancreatitis without infection or necrosis    Rx / DC Orders ED Discharge Orders    None       Deno Etienne, DO 11/20/20 0205

## 2020-11-19 NOTE — ED Triage Notes (Addendum)
Pt c/o severe upper abd pain starting around 9pm, states she is supposed to have her gallbladder removed but has not had it scheduled yet. Denies fevers, n/v

## 2020-11-20 ENCOUNTER — Encounter (HOSPITAL_COMMUNITY): Payer: Self-pay | Admitting: Family Medicine

## 2020-11-20 DIAGNOSIS — K859 Acute pancreatitis without necrosis or infection, unspecified: Secondary | ICD-10-CM | POA: Diagnosis present

## 2020-11-20 DIAGNOSIS — K810 Acute cholecystitis: Secondary | ICD-10-CM | POA: Diagnosis not present

## 2020-11-20 DIAGNOSIS — K8020 Calculus of gallbladder without cholecystitis without obstruction: Secondary | ICD-10-CM

## 2020-11-20 DIAGNOSIS — J309 Allergic rhinitis, unspecified: Secondary | ICD-10-CM | POA: Diagnosis present

## 2020-11-20 DIAGNOSIS — F32A Depression, unspecified: Secondary | ICD-10-CM | POA: Diagnosis present

## 2020-11-20 DIAGNOSIS — F419 Anxiety disorder, unspecified: Secondary | ICD-10-CM | POA: Diagnosis present

## 2020-11-20 DIAGNOSIS — Z8719 Personal history of other diseases of the digestive system: Secondary | ICD-10-CM | POA: Diagnosis present

## 2020-11-20 DIAGNOSIS — Z79899 Other long term (current) drug therapy: Secondary | ICD-10-CM | POA: Diagnosis not present

## 2020-11-20 DIAGNOSIS — Z793 Long term (current) use of hormonal contraceptives: Secondary | ICD-10-CM | POA: Diagnosis not present

## 2020-11-20 DIAGNOSIS — Z888 Allergy status to other drugs, medicaments and biological substances status: Secondary | ICD-10-CM | POA: Diagnosis not present

## 2020-11-20 DIAGNOSIS — F909 Attention-deficit hyperactivity disorder, unspecified type: Secondary | ICD-10-CM | POA: Diagnosis present

## 2020-11-20 DIAGNOSIS — K760 Fatty (change of) liver, not elsewhere classified: Secondary | ICD-10-CM | POA: Diagnosis present

## 2020-11-20 DIAGNOSIS — F649 Gender identity disorder, unspecified: Secondary | ICD-10-CM | POA: Diagnosis present

## 2020-11-20 DIAGNOSIS — Z20822 Contact with and (suspected) exposure to covid-19: Secondary | ICD-10-CM | POA: Diagnosis present

## 2020-11-20 DIAGNOSIS — K851 Biliary acute pancreatitis without necrosis or infection: Secondary | ICD-10-CM | POA: Diagnosis present

## 2020-11-20 DIAGNOSIS — E785 Hyperlipidemia, unspecified: Secondary | ICD-10-CM | POA: Diagnosis present

## 2020-11-20 DIAGNOSIS — M7918 Myalgia, other site: Secondary | ICD-10-CM | POA: Diagnosis present

## 2020-11-20 LAB — URINALYSIS, ROUTINE W REFLEX MICROSCOPIC
Bacteria, UA: NONE SEEN
Bilirubin Urine: NEGATIVE
Glucose, UA: NEGATIVE mg/dL
Ketones, ur: NEGATIVE mg/dL
Leukocytes,Ua: NEGATIVE
Nitrite: NEGATIVE
Protein, ur: NEGATIVE mg/dL
Specific Gravity, Urine: 1.017 (ref 1.005–1.030)
pH: 6 (ref 5.0–8.0)

## 2020-11-20 LAB — COMPREHENSIVE METABOLIC PANEL
ALT: 158 U/L — ABNORMAL HIGH (ref 0–44)
AST: 189 U/L — ABNORMAL HIGH (ref 15–41)
Albumin: 3.8 g/dL (ref 3.5–5.0)
Alkaline Phosphatase: 110 U/L (ref 38–126)
Anion gap: 10 (ref 5–15)
BUN: 15 mg/dL (ref 6–20)
CO2: 20 mmol/L — ABNORMAL LOW (ref 22–32)
Calcium: 8.7 mg/dL — ABNORMAL LOW (ref 8.9–10.3)
Chloride: 108 mmol/L (ref 98–111)
Creatinine, Ser: 0.71 mg/dL (ref 0.44–1.00)
GFR, Estimated: >60 mL/min (ref >60)
Glucose, Bld: 99 mg/dL (ref 70–99)
Potassium: 3.8 mmol/L (ref 3.5–5.1)
Sodium: 138 mmol/L (ref 135–145)
Total Bilirubin: 0.7 mg/dL (ref 0.3–1.2)
Total Protein: 7.1 g/dL (ref 6.5–8.1)

## 2020-11-20 LAB — CBC
HCT: 37.1 % (ref 36.0–46.0)
Hemoglobin: 12.3 g/dL (ref 12.0–15.0)
MCH: 31 pg (ref 26.0–34.0)
MCHC: 33.2 g/dL (ref 30.0–36.0)
MCV: 93.5 fL (ref 80.0–100.0)
Platelets: 272 10*3/uL (ref 150–400)
RBC: 3.97 MIL/uL (ref 3.87–5.11)
RDW: 12.1 % (ref 11.5–15.5)
WBC: 5.5 10*3/uL (ref 4.0–10.5)
nRBC: 0 % (ref 0.0–0.2)

## 2020-11-20 LAB — RESP PANEL BY RT-PCR (FLU A&B, COVID) ARPGX2
Influenza A by PCR: NEGATIVE
Influenza B by PCR: NEGATIVE
SARS Coronavirus 2 by RT PCR: NEGATIVE

## 2020-11-20 LAB — SURGICAL PCR SCREEN
MRSA, PCR: NEGATIVE
Staphylococcus aureus: POSITIVE — AB

## 2020-11-20 LAB — LIPASE, BLOOD
Lipase: 1212 U/L — ABNORMAL HIGH (ref 11–51)
Lipase: 174 U/L — ABNORMAL HIGH (ref 11–51)

## 2020-11-20 LAB — HCG, QUANTITATIVE, PREGNANCY: hCG, Beta Chain, Quant, S: <1 m[IU]/mL (ref <5)

## 2020-11-20 MED ORDER — CELECOXIB 200 MG PO CAPS
200.0000 mg | ORAL_CAPSULE | ORAL | Status: AC
  Administered 2020-11-21: 200 mg via ORAL
  Filled 2020-11-20: qty 1

## 2020-11-20 MED ORDER — IOHEXOL 9 MG/ML PO SOLN
500.0000 mL | ORAL | Status: AC
  Administered 2020-11-20: 500 mL via ORAL

## 2020-11-20 MED ORDER — SODIUM CHLORIDE 0.9 % IV SOLN
2.0000 g | INTRAVENOUS | Status: AC
  Administered 2020-11-21: 2 g via INTRAVENOUS
  Filled 2020-11-20: qty 20

## 2020-11-20 MED ORDER — ACETAMINOPHEN 500 MG PO TABS
1000.0000 mg | ORAL_TABLET | ORAL | Status: AC
  Administered 2020-11-21: 1000 mg via ORAL
  Filled 2020-11-20: qty 2

## 2020-11-20 MED ORDER — MORPHINE SULFATE (PF) 4 MG/ML IV SOLN
4.0000 mg | Freq: Once | INTRAVENOUS | Status: AC
Start: 2020-11-20 — End: 2020-11-20
  Administered 2020-11-20: 4 mg via INTRAVENOUS
  Filled 2020-11-20: qty 1

## 2020-11-20 MED ORDER — SODIUM CHLORIDE 0.9 % IV SOLN: INTRAVENOUS | Status: DC

## 2020-11-20 MED ORDER — HYDROMORPHONE HCL 1 MG/ML IJ SOLN
1.0000 mg | INTRAMUSCULAR | Status: DC | PRN
Start: 2020-11-20 — End: 2020-11-21

## 2020-11-20 MED ORDER — IOHEXOL 9 MG/ML PO SOLN
ORAL | Status: AC
  Administered 2020-11-20: 500 mL
  Filled 2020-11-20: qty 1000

## 2020-11-20 MED ORDER — ACETAMINOPHEN 650 MG RE SUPP: 650.0000 mg | Freq: Four times a day (QID) | RECTAL | Status: DC | PRN

## 2020-11-20 MED ORDER — ACETAMINOPHEN 325 MG PO TABS
650.0000 mg | ORAL_TABLET | Freq: Four times a day (QID) | ORAL | Status: DC | PRN
  Administered 2020-11-20: 650 mg via ORAL
  Filled 2020-11-20: qty 2

## 2020-11-20 MED ORDER — ONDANSETRON HCL 4 MG/2ML IJ SOLN: 4.0000 mg | Freq: Four times a day (QID) | INTRAMUSCULAR | Status: DC | PRN

## 2020-11-20 MED ORDER — ONDANSETRON HCL 4 MG PO TABS
4.0000 mg | ORAL_TABLET | Freq: Four times a day (QID) | ORAL | Status: DC | PRN
  Administered 2020-11-21: 4 mg via ORAL
  Filled 2020-11-20: qty 1

## 2020-11-20 NOTE — H&P (Signed)
History and Physical    Julie Anderson TIW:[id number] DOB: September 25, 1995 DOA: 11/19/2020  PCP: Binnie Rail, MD   Patient coming from: Home  Chief Complaint: abdominal pain  HPI: Julie Anderson is a 25 y.o. adult with medical history significant for ADD, anxiety, depression, gallstones who presents for evaluation of epigastric and right upper quadrant abdominal pain.  Going on since about 6 PM yesterday. States it started about two hours after eating a ham and cheese sandwich. Describes it as a feeling like a knife was being stabbed to the upper portion of the abdomen and pulled downwards.  Has had pain like this off and on in the past.  Seen her family doctor and had an ultrasound that showed gallstones but without acute cholecystitis.  Plan to follow-up with general surgery but has not been seen in consultation yet. Has nausea but no vomiting. No diarrhea. No urinary frequency or dysuria. States she has had similar pain but not as severe in past and notes it generally happens a short time after eating.   ED Course: Had an ultrasound which did show cholelithiasis but no sonographic evidence of cholecystitis.  Patient significant abdominal pain.  Found to have significantly elevated lipase level over 1200.  ER physician reports he did consult surgery  Review of Systems:  General: Denies fever, chills, weight loss, night sweats.  Denies dizziness. Reports decreased appetite HENT: Denies head trauma, headache, denies change in hearing, tinnitus.  Denies nasal bleeding.  Denies sore throat, sores in mouth.  Denies difficulty swallowing Eyes: Denies blurry vision, pain in eye, drainage.  Denies discoloration of eyes. Neck: Denies pain.  Denies swelling.  Denies pain with movement. Cardiovascular: Denies chest pain, palpitations.  Denies edema.  Denies orthopnea Respiratory: Denies shortness of breath, cough.  Denies wheezing.  Denies sputum production Gastrointestinal: Reports abdominal pain with nausea.  Denies swelling.  Denies vomiting, diarrhea.  Denies melena.  Denies hematemesis. Musculoskeletal: Denies limitation of movement.  Denies deformity or swelling.  Denies pain.  Denies arthralgias or myalgias. Genitourinary: Denies pelvic pain.  Denies urinary frequency or hesitancy.  Denies dysuria.  Skin: Denies rash.  Denies petechiae, purpura, ecchymosis. Neurological: Denies headache.  Denies syncope.  Denies seizure activity.  Denies weakness or paresthesia.  Denies slurred speech, drooping face.  Denies visual change. Psychiatric: Denies depression, anxiety. Denies hallucinations.  Past Medical History:  Diagnosis Date  . ADD (attention deficit disorder)   . Anxiety   . Depression   . Hyperhomocysteinemia ([place])     Past Surgical History:  Procedure Laterality Date  . TYMPANOSTOMY TUBE PLACEMENT      Social History  reports that Julie E. Danielsen "Max" has never smoked. Julie E. Swim "Max" has never used smokeless tobacco. Julie E. Redditt "Max" reports current alcohol use. Julie E. Delangel "Max" reports current drug use. Drug: Marijuana.  Allergies  Allergen Reactions  . Cymbalta [Duloxetine Hcl]     Nausea, disorientation    Family History  Adopted: Yes     Prior to Admission medications   Medication Sig Start Date End Date Taking? Authorizing Provider  amphetamine-dextroamphetamine (ADDERALL) 20 MG tablet Take 1 tablet (20 mg total) by mouth daily. Patient taking differently: Take 20 mg by mouth daily as needed. 10/01/20  Yes Binnie Rail, MD  etonogestrel (NEXPLANON) 68 MG IMPL implant 1 each by Subdermal route once. 2016 placed   Yes [provider]  Folic Acid-Vit S5-KNL Z76 (FOLBEE) 2.5-25-1 MG TABS tablet Take 1 tablet by  mouth daily. Follow-up appt is due in must see provider for future refills 11/06/19  Yes Binnie Rail, MD    Physical Exam: Vitals:   11/20/20 0030 11/20/20 0100 11/20/20 0130 11/20/20 0215  BP: 112/65 (!) 99/54 109/87 103/70  Pulse: 78 76 74  70  Resp: 18 18 18 18   Temp:      TempSrc:      SpO2: 99% 97% 100% 98%  Weight:      Height:        Constitutional: NAD, calm, comfortable Vitals:   11/20/20 0030 11/20/20 0100 11/20/20 0130 11/20/20 0215  BP: 112/65 (!) 99/54 109/87 103/70  Pulse: 78 76 74 70  Resp: 18 18 18 18   Temp:      TempSrc:      SpO2: 99% 97% 100% 98%  Weight:      Height:       General: WDWN, Alert and oriented x3.  Eyes: EOMI, PERRL, conjunctivae normal.  Sclera nonicteric HENT:  [place]/AT, external ears normal.  Nares patent without epistasis.  Mucous membranes are dry  Neck: Soft, normal range of motion, supple, no masses, no thyromegaly.  Trachea midline Respiratory: clear to auscultation bilaterally, no wheezing, no crackles. Normal respiratory effort. No accessory muscle use.  Cardiovascular: Regular rate and rhythm, no murmurs / rubs / gallops. No extremity edema. 2+ pedal pulses.  Abdomen: Soft, right upper quadrant and epigastric tenderness, nondistended, no rebound or guarding.  No masses palpated. No hepatosplenomegaly. Bowel sounds normoactive.  Negative Murphy sign.  Negative McBurney point tenderness. Musculoskeletal: FROM. no cyanosis. No joint deformity upper and lower extremities. Normal muscle tone.  Skin: Warm, dry, intact no rashes, lesions, ulcers. No induration.  Multiple tattoos Neurologic: CN 2-12 grossly intact.  Normal speech.  Sensation intact,  Strength 5/5 in all extremities.   Psychiatric: Normal judgment and insight.  Normal mood.    Labs on Admission: I have personally reviewed following labs and imaging studies  CBC: Recent Labs  Lab 11/19/20 2217  WBC 9.1  HGB 13.4  HCT 40.3  MCV 92.4  PLT 250    Basic Metabolic Panel: Recent Labs  Lab 11/19/20 2217  NA 142  K 4.0  CL 110  CO2 21*  GLUCOSE 123*  BUN 16  CREATININE 0.85  CALCIUM 9.6    GFR: Estimated Creatinine Clearance (by C-G formula based on SCr of 0.85 mg/dL) Female: 84.5 mL/min Female: 104.6  mL/min  Liver Function Tests: Recent Labs  Lab 11/19/20 2217  AST 175*  ALT 108*  ALKPHOS 115  BILITOT 1.2  PROT 8.0  ALBUMIN 4.1    Urine analysis:    Component Value Date/Time   COLORURINE YELLOW 11/07/2013 [place] 11/07/2013 1749   LABSPEC 1.020 05/27/2018 1749   PHURINE 5.5 05/27/2018 1749   GLUCOSEU 100 (A) 05/27/2018 1749   HGBUR LARGE (A) 05/27/2018 1749   BILIRUBINUR LARGE (A) 05/27/2018 1749   BILIRUBINUR negative 01/10/2014 1321   BILIRUBINUR neg 09/19/2013 0929   KETONESUR 15 (A) 05/27/2018 1749   PROTEINUR >=300 (A) 05/27/2018 1749   UROBILINOGEN 4.0 (H) 05/27/2018 1749   NITRITE POSITIVE (A) 05/27/2018 1749   LEUKOCYTESUR LARGE (A) 05/27/2018 1749    Radiological Exams on Admission: US Abdomen Limited RUQ (LIVER/GB)  Result Date: 11/19/2020 CLINICAL DATA:  Right upper quadrant pain EXAM: ULTRASOUND ABDOMEN LIMITED RIGHT UPPER QUADRANT COMPARISON:  Ultrasound 11/13/2020 FINDINGS: Gallbladder: Redemonstration of the tiny layering, shadowing gallstones within the gallbladder lumen, likely a mixture  of biliary stones/biliary sand. No abnormal gallbladder wall thickening. No pericholecystic fluid. Sonographic Percell Miller sign is reportedly negative. Common bile duct: Diameter: 4 mm, nondilated Liver: Mildly increased hepatic echogenicity with loss of definition of the portal triads. No focal liver lesion. Portal vein is patent on color Doppler imaging with normal direction of blood flow towards the liver. Other: None. IMPRESSION: 1. Cholelithiasis without sonographic evidence of acute cholecystitis. 2. Likely mild hepatic steatosis. Electronically Signed   By: Lovena Le M.D.   On: 11/19/2020 23:59    Assessment/Plan Principal Problem:   Pancreatitis Ms. Losh is admitted to med/surg floor.  IVF hydration provided.  Antiemetics and IV pain medication provided. Patient is not having nausea or vomiting and states she is thirsty.  Will allow clear  liquids.  Studies of shown that continuing to use the GI track with clear liquids is not harmful and pancreatitis Will obtain CT abdomen to make sure no necrosis of pancreas.   Active Problems:   Acute cholecystitis You have been consulted by the ER provider he states.  Patient has known gallstones.  Patient may need ERCP to make sure there is no stone lodged causing obstruction. IV fluids and pain medication as above.    DVT prophylaxis: Padua score low. Ambulation and TED hose for DVT prophylaxis. Code Status:   Full code  Family Communication:  Diagnosis and plan discussed with patient and family member at bedside.  Questions answered.  They verbalized understanding and agree with plan.  Further recommendations to follow as clinically indicated Disposition Plan:   Patient is from:  Home  Anticipated DC to:  Home  Anticipated DC date:  Anticipate 2 midnight or more stay in the hospital  Anticipated DC barriers: No barriers to discharge identified at this time  Consults called:  Surgery, called by ER physician.  Admission status:  Inpatient   Yevonne Aline Chotiner MD [facility]  How to contact the [facility] Attending or Consulting provider [place] or covering provider during after hours [place], for this patient?   1. Check the care team in [facility] and look for a) attending/consulting [facility] provider listed and b) the [facility] team listed 2. Log into [email or web address] and use [place]'s universal password to access. If you do not have the password, please contact the hospital operator. 3. Locate the [facility] provider you are looking for under [facility] and page to a number that you can be directly reached. 4. If you still have difficulty reaching the provider, please page the [facility] (Director on Call) for the Hospitalists listed on amion for assistance.  11/20/2020, 3:05 AM

## 2020-11-20 NOTE — Consult Note (Signed)
Julie Anderson [birth year]  [id number].    Requesting MD: Dr. Flora Lipps Chief Complaint/Reason for Consult: Gallstone pancreatitis  HPI:  This is a 25 year old female with a history of hyperhomocystinemia, ADD, myofascial pain syndrome, and anxiety/depression who has been having episodes of right upper quadrant and epigastric abdominal pain since January of this year.  It generally occurs after eating.  At this point all foods irritate her except for something as simple as broth.  She has had prior nausea and vomiting.  She has been evaluated by her PCP who obtained an abdominal ultrasound that revealed gallstones.  She has been set up to see a Psychologist, sport and exercise in our office in April.  Unfortunately last night around 1830 she developed severe sharp epigastric abdominal pain.  She denies any nausea or vomiting, chest pain, or shortness of breath.  She presented to the emergency department where she was found to have a lipase of 1200.  She had an abdominal ultrasound which revealed cholelithiasis but no evidence for cholecystitis.  Her AST and ALT are elevated at 189 and 158 respectively.  Her alkaline phosphatase and total bilirubin are normal.  Of note her transaminases have been elevated for the last 2 months with an AST of 226 and ALT of 142 months ago.  Her ultrasound does show some evidence of fatty liver disease.  The patient has been admitted by the medical service and we have been asked to evaluate the patient today for gallstone pancreatitis.  ROS: ROS: Please see HPI, otherwise all other systems have been reviewed and are negative.  Family History  Adopted: Yes    Past Medical History:  Diagnosis Date   ADD (attention deficit disorder)    Anxiety    Depression    Hyperhomocysteinemia ([place])     Past Surgical History:  Procedure Laterality Date   TYMPANOSTOMY TUBE PLACEMENT      Social History:  reports that Chad E. Gilleland "Max" has never smoked. Anatalia E. Roszak "Max" has never  used smokeless tobacco. Seraphine E. Fann "Max" reports current alcohol use. Aneta E. Ahlin "Max" reports current drug use. Drug: Marijuana.  Allergies:  Allergies  Allergen Reactions   Cymbalta [Duloxetine Hcl]     Nausea, disorientation    Medications Prior to Admission  Medication Sig Dispense Refill   amphetamine-dextroamphetamine (ADDERALL) 20 MG tablet Take 1 tablet (20 mg total) by mouth daily. (Patient taking differently: Take 20 mg by mouth daily as needed.) 30 tablet 0   etonogestrel (NEXPLANON) 68 MG IMPL implant 1 each by Subdermal route once. 2637 placed     Folic Acid-Vit C5-YIF O27 (FOLBEE) 2.5-25-1 MG TABS tablet Take 1 tablet by mouth daily. Follow-up appt is due in must see provider for future refills 90 tablet 0     Physical Exam: Blood pressure 94/66, pulse 63, temperature 98.2 F (36.8 C), temperature source Oral, resp. rate 18, height 4' 11.5" (1.511 m), weight 63.5 kg, last menstrual period 11/17/2020, SpO2 97 %. General: pleasant, WD, WN female who is laying in bed in NAD HEENT: head is normocephalic, atraumatic.  Sclera are noninjected.  PERRL.  Ears and nose without any masses or lesions.  Mouth is pink and moist Heart: regular, rate, and rhythm.  Normal s1,s2. No obvious murmurs, gallops, or rubs noted.  Palpable radial and pedal pulses bilaterally Lungs: CTAB, no wheezes, rhonchi, or rales noted.  Respiratory effort nonlabored Abd: soft, minimally tender in her epigastrium, no pain in her right upper quadrant or  elsewhere. ND, +BS, no masses, hernias, or organomegaly MS: all 4 extremities are symmetrical with no cyanosis, clubbing, or edema. Skin: warm and dry with no masses, lesions, or rashes Neuro: Cranial nerves 2-12 grossly intact, sensation is normal throughout Psych: A&Ox3 with an appropriate affect.   Results for orders placed or performed during the hospital encounter of 11/19/20 (from the past 48 hour(s))  Lipase, blood     Status: Abnormal    Collection Time: 11/19/20 10:17 PM  Result Value Ref Range   Lipase 1,212 (H) 11 - 51 U/L    Comment: RESULTS CONFIRMED BY MANUAL DILUTION Performed at [facility] [address], [place], [place] [address]   Comprehensive metabolic panel     Status: Abnormal   Collection Time: 11/19/20 10:17 PM  Result Value Ref Range   Sodium 142 135 - 145 mmol/L   Potassium 4.0 3.5 - 5.1 mmol/L   Chloride 110 98 - 111 mmol/L   CO2 21 (L) 22 - 32 mmol/L   Glucose, Bld 123 (H) 70 - 99 mg/dL    Comment: Glucose reference range applies only to samples taken after fasting for at least 8 hours.   BUN 16 6 - 20 mg/dL   Creatinine, Ser 0.85 0.44 - 1.00 mg/dL   Calcium 9.6 8.9 - 10.3 mg/dL   Total Protein 8.0 6.5 - 8.1 g/dL   Albumin 4.1 3.5 - 5.0 g/dL   AST 175 (H) 15 - 41 U/L   ALT 108 (H) 0 - 44 U/L   Alkaline Phosphatase 115 38 - 126 U/L   Total Bilirubin 1.2 0.3 - 1.2 mg/dL   GFR, Estimated >60 >60 mL/min    Comment: (NOTE) Calculated using the CKD-EPI Creatinine Equation (2021)    Anion gap 11 5 - 15    Comment: Performed at [facility], [place] [address] [place], [place] [address]  CBC     Status: None   Collection Time: 11/19/20 10:17 PM  Result Value Ref Range   WBC 9.1 4.0 - 10.5 K/uL   RBC 4.36 3.87 - 5.11 MIL/uL   Hemoglobin 13.4 12.0 - 15.0 g/dL   HCT 40.3 36.0 - 46.0 %   MCV 92.4 80.0 - 100.0 fL   MCH 30.7 26.0 - 34.0 pg   MCHC 33.3 30.0 - 36.0 g/dL   RDW 12.0 11.5 - 15.5 %   Platelets 331 150 - 400 K/uL   nRBC 0.0 0.0 - 0.2 %    Comment: Performed at [facility], [place] [address] [place],  [address]  hCG, quantitative, pregnancy     Status: None   Collection Time: 11/19/20 10:17 PM  Result Value Ref Range   hCG, Beta Chain, Quant, S <1 <5 mIU/mL    Comment:          GEST. AGE      CONC.  (mIU/mL)   <=1 WEEK        5 - 50     2 WEEKS       50 - 500     3 WEEKS       100 - 10,000     4 WEEKS     1,000 -  30,000     5 WEEKS     3,500 - 115,000   6-8 WEEKS     12,000 - 270,000    12 WEEKS     15,000 - 220,000        FEMALE  AND NON-PREGNANT FEMALE:     LESS THAN 5 mIU/mL Performed at [facility], [place] [address] [place], [place] [address]   Resp Panel by RT-PCR (Flu A&B, Covid) Nasopharyngeal Swab     Status: None   Collection Time: 11/20/20  1:27 AM   Specimen: Nasopharyngeal Swab; Nasopharyngeal(NP) swabs in vial transport medium  Result Value Ref Range   SARS Coronavirus 2 by RT PCR NEGATIVE NEGATIVE    Comment: (NOTE) SARS-CoV-2 target nucleic acids are NOT DETECTED.  The SARS-CoV-2 RNA is generally detectable in upper respiratory specimens during the acute phase of infection. The lowest concentration of SARS-CoV-2 viral copies this assay can detect is 138 copies/mL. A negative result does not preclude SARS-Cov-2 infection and should not be used as the sole basis for treatment or other patient management decisions. A negative result may occur with  improper specimen collection/handling, submission of specimen other than nasopharyngeal swab, presence of viral mutation(s) within the areas targeted by this assay, and inadequate number of viral copies(<138 copies/mL). A negative result must be combined with clinical observations, patient history, and epidemiological information. The expected result is Negative.  Fact Sheet for Patients:  EntrepreneurPulse.com.au  Fact Sheet for Healthcare Providers:  IncredibleEmployment.be  This test is no t yet approved or cleared by the [place] FDA and  has been authorized for detection and/or diagnosis of SARS-CoV-2 by FDA under an Emergency Use Authorization (EUA). This EUA will remain  in effect (meaning this test can be used) for the duration of the COVID-19 declaration under Section 564(b)(1) of the Act, 21 U.S.C.section 360bbb-3(b)(1), unless the authorization is terminated  or  revoked sooner.       Influenza A by PCR NEGATIVE NEGATIVE   Influenza B by PCR NEGATIVE NEGATIVE    Comment: (NOTE) The Xpert Xpress SARS-CoV-2/FLU/RSV plus assay is intended as an aid in the diagnosis of influenza from Nasopharyngeal swab specimens and should not be used as a sole basis for treatment. Nasal washings and aspirates are unacceptable for Xpert Xpress SARS-CoV-2/FLU/RSV testing.  Fact Sheet for Patients: EntrepreneurPulse.com.au  Fact Sheet for Healthcare Providers: IncredibleEmployment.be  This test is not yet approved or cleared by the [place] FDA and has been authorized for detection and/or diagnosis of SARS-CoV-2 by FDA under an Emergency Use Authorization (EUA). This EUA will remain in effect (meaning this test can be used) for the duration of the COVID-19 declaration under Section 564(b)(1) of the Act, 21 U.S.C. section 360bbb-3(b)(1), unless the authorization is terminated or revoked.  Performed at [facility], [place] [address], [place], [place] [address]   CBC     Status: None   Collection Time: 11/20/20  4:57 AM  Result Value Ref Range   WBC 5.5 4.0 - 10.5 K/uL   RBC 3.97 3.87 - 5.11 MIL/uL   Hemoglobin 12.3 12.0 - 15.0 g/dL   HCT 37.1 36.0 - 46.0 %   MCV 93.5 80.0 - 100.0 fL   MCH 31.0 26.0 - 34.0 pg   MCHC 33.2 30.0 - 36.0 g/dL   RDW 12.1 11.5 - 15.5 %   Platelets 272 150 - 400 K/uL   nRBC 0.0 0.0 - 0.2 %    Comment: Performed at [facility], [place] [address] [place], [place] [address]  Comprehensive metabolic panel     Status: Abnormal   Collection Time: 11/20/20  4:57 AM  Result Value Ref Range   Sodium 138 135 - 145 mmol/L   Potassium 3.8  3.5 - 5.1 mmol/L   Chloride 108 98 - 111 mmol/L   CO2 20 (L) 22 - 32 mmol/L   Glucose, Bld 99 70 - 99 mg/dL    Comment: Glucose reference range applies only to samples taken after fasting for at least 8 hours.   BUN 15 6 - 20  mg/dL   Creatinine, Ser 0.71 0.44 - 1.00 mg/dL   Calcium 8.7 (L) 8.9 - 10.3 mg/dL   Total Protein 7.1 6.5 - 8.1 g/dL   Albumin 3.8 3.5 - 5.0 g/dL   AST 189 (H) 15 - 41 U/L   ALT 158 (H) 0 - 44 U/L   Alkaline Phosphatase 110 38 - 126 U/L   Total Bilirubin 0.7 0.3 - 1.2 mg/dL   GFR, Estimated >60 >60 mL/min    Comment: (NOTE) Calculated using the CKD-EPI Creatinine Equation (2021)    Anion gap 10 5 - 15    Comment: Performed at [facility], [place] [address] [place], [place] [address]  Lipase, blood     Status: Abnormal   Collection Time: 11/20/20  4:57 AM  Result Value Ref Range   Lipase 174 (H) 11 - 51 U/L    Comment: Performed at [facility], [place] [address] [place], [place] [address]  Urinalysis, Routine w reflex microscopic Urine, Clean Catch     Status: Abnormal   Collection Time: 11/20/20  9:03 AM  Result Value Ref Range   Color, Urine YELLOW YELLOW   APPearance CLEAR CLEAR   Specific Gravity, Urine 1.017 1.005 - 1.030   pH 6.0 5.0 - 8.0   Glucose, UA NEGATIVE NEGATIVE mg/dL   Hgb urine dipstick MODERATE (A) NEGATIVE   Bilirubin Urine NEGATIVE NEGATIVE   Ketones, ur NEGATIVE NEGATIVE mg/dL   Protein, ur NEGATIVE NEGATIVE mg/dL   Nitrite NEGATIVE NEGATIVE   Leukocytes,Ua NEGATIVE NEGATIVE   RBC / HPF 0-5 0 - 5 RBC/hpf   WBC, UA 0-5 0 - 5 WBC/hpf   Bacteria, UA NONE SEEN NONE SEEN   Squamous Epithelial / LPF 0-5 0 - 5   Mucus PRESENT     Comment: Performed at [facility], [place] [address] [place], [place] [address]   US Abdomen Limited RUQ (LIVER/GB)  Result Date: 11/19/2020 CLINICAL DATA:  Right upper quadrant pain EXAM: ULTRASOUND ABDOMEN LIMITED RIGHT UPPER QUADRANT COMPARISON:  Ultrasound 11/13/2020 FINDINGS: Gallbladder: Redemonstration of the tiny layering, shadowing gallstones within the gallbladder lumen, likely a mixture of biliary stones/biliary sand. No abnormal gallbladder wall thickening. No  pericholecystic fluid. Sonographic Percell Miller sign is reportedly negative. Common bile duct: Diameter: 4 mm, nondilated Liver: Mildly increased hepatic echogenicity with loss of definition of the portal triads. No focal liver lesion. Portal vein is patent on color Doppler imaging with normal direction of blood flow towards the liver. Other: None. IMPRESSION: 1. Cholelithiasis without sonographic evidence of acute cholecystitis. 2. Likely mild hepatic steatosis. Electronically Signed   By: Lovena Le M.D.   On: 11/19/2020 23:59      Assessment/Plan Depression/anxiety Hyperhomocystinemia Myofascial pain syndrome  Gallstone pancreatitis The patient has evidence of gallstones on her abdominal ultrasound.  She was also found to have an elevated lipase of 1200 on admission.  She does not appear to have evidence of cholecystitis on her ultrasound.  A CT scan has been ordered but given the patient's abdominal pain has almost essentially resolved today, there is no need for further imaging at this time as it will not change  the treatment plan.  She may continue to have clear liquids today but we will make her n.p.o. after midnight tonight in preparation for surgical intervention tomorrow for laparoscopic cholecystectomy.  I have explained the anatomy and pathophysiology of gallstone pancreatitis as well as how we do a laparoscopic cholecystectomy.  I drew a picture for the patient as well as her mother.  They both understand and are agreeable to proceed with surgery tomorrow barring nothing changes in her clinical status or in our surgical scheduling.   FEN -clear liquid diet, n.p.o. after midnight, IVFs VTE -TED hose, okay for chemical prophylaxis from our standpoint ID -none currently needed, will give prophylactic dose on-call to the operating room tomorrow  Henreitta Cea, [facility] 11/20/2020, 1:15 PM Please see Amion for pager number during day hours 7:00am-4:30pm or 7:00am  -11:30am on weekends

## 2020-11-20 NOTE — Progress Notes (Signed)
Same day note  Patient seen and examined at bedside.  Patient was admitted to the hospital for acute pancreatitis  At the time of my evaluation, patient complains of mild epigastric pain.  No nausea or vomiting.  Has not had a bowel movement today.  Physical examination reveals female not in obvious distress, epigastric tenderness noted.  Laboratory data and imaging was reviewed  Assessment and Plan.  Acute pancreatitis.  Likely gallstone pancreatitis.  Mildly elevated LFTs.  Right upper quadrant ultrasound shows gallstones without cholecystitis.  CT scan of the abdomen and pelvis pending.  Will get surgical consultation.  Currently NPO.  Will start clears.  Continue IV fluids, antiemetics, analgesics and supportive care.  Lipase of 1212 on presentation.  Will trend lipase.  Elevated total cholesterol, triglyceride of 251.  Hemoglobin A1c of 5.5.  No Charge  Signed,  Delila Pereyra, MD [facility]

## 2020-11-21 ENCOUNTER — Encounter (HOSPITAL_COMMUNITY)
Admission: EM | Disposition: A | Discharge status: Home / Self Care | Payer: Self-pay | Source: Home / Self Care | Attending: Internal Medicine

## 2020-11-21 ENCOUNTER — Inpatient Hospital Stay (HOSPITAL_COMMUNITY): Payer: 59 | Admitting: Anesthesiology

## 2020-11-21 ENCOUNTER — Encounter (HOSPITAL_COMMUNITY): Payer: Self-pay | Admitting: Family Medicine

## 2020-11-21 ENCOUNTER — Inpatient Hospital Stay (HOSPITAL_COMMUNITY): Payer: 59

## 2020-11-21 DIAGNOSIS — E785 Hyperlipidemia, unspecified: Secondary | ICD-10-CM

## 2020-11-21 DIAGNOSIS — K810 Acute cholecystitis: Secondary | ICD-10-CM

## 2020-11-21 DIAGNOSIS — K851 Biliary acute pancreatitis without necrosis or infection: Principal | ICD-10-CM

## 2020-11-21 HISTORY — PX: CHOLECYSTECTOMY: SHX55

## 2020-11-21 LAB — CBC
HCT: 37.2 % (ref 36.0–46.0)
Hemoglobin: 12.1 g/dL (ref 12.0–15.0)
MCH: 30.8 pg (ref 26.0–34.0)
MCHC: 32.5 g/dL (ref 30.0–36.0)
MCV: 94.7 fL (ref 80.0–100.0)
Platelets: 274 10*3/uL (ref 150–400)
RBC: 3.93 MIL/uL (ref 3.87–5.11)
RDW: 12.1 % (ref 11.5–15.5)
WBC: 4.4 10*3/uL (ref 4.0–10.5)
nRBC: 0 % (ref 0.0–0.2)

## 2020-11-21 LAB — COMPREHENSIVE METABOLIC PANEL
ALT: 96 U/L — ABNORMAL HIGH (ref 0–44)
AST: 57 U/L — ABNORMAL HIGH (ref 15–41)
Albumin: 3.6 g/dL (ref 3.5–5.0)
Alkaline Phosphatase: 88 U/L (ref 38–126)
Anion gap: 7 (ref 5–15)
BUN: 7 mg/dL (ref 6–20)
CO2: 22 mmol/L (ref 22–32)
Calcium: 8.9 mg/dL (ref 8.9–10.3)
Chloride: 112 mmol/L — ABNORMAL HIGH (ref 98–111)
Creatinine, Ser: 0.7 mg/dL (ref 0.44–1.00)
GFR, Estimated: >60 mL/min (ref >60)
Glucose, Bld: 95 mg/dL (ref 70–99)
Potassium: 3.9 mmol/L (ref 3.5–5.1)
Sodium: 141 mmol/L (ref 135–145)
Total Bilirubin: 0.8 mg/dL (ref 0.3–1.2)
Total Protein: 6.6 g/dL (ref 6.5–8.1)

## 2020-11-21 LAB — PHOSPHORUS: Phosphorus: 4.3 mg/dL (ref 2.5–4.6)

## 2020-11-21 LAB — MAGNESIUM: Magnesium: 2.1 mg/dL (ref 1.7–2.4)

## 2020-11-21 SURGERY — LAPAROSCOPIC CHOLECYSTECTOMY WITH INTRAOPERATIVE CHOLANGIOGRAM
Anesthesia: General

## 2020-11-21 MED ORDER — PROPOFOL 10 MG/ML IV BOLUS
INTRAVENOUS | Status: AC
  Filled 2020-11-21: qty 20

## 2020-11-21 MED ORDER — MUPIROCIN 2 % EX OINT
1.0000 "application " | TOPICAL_OINTMENT | Freq: Two times a day (BID) | CUTANEOUS | Status: DC
  Filled 2020-11-21: qty 22

## 2020-11-21 MED ORDER — CHLORHEXIDINE GLUCONATE CLOTH 2 % EX PADS: 6.0000 | MEDICATED_PAD | Freq: Every day | CUTANEOUS | Status: DC

## 2020-11-21 MED ORDER — PHENYLEPHRINE 40 MCG/ML (10ML) SYRINGE FOR IV PUSH (FOR BLOOD PRESSURE SUPPORT)
PREFILLED_SYRINGE | INTRAVENOUS | Status: DC | PRN
  Administered 2020-11-21: 80 ug via INTRAVENOUS

## 2020-11-21 MED ORDER — BUPIVACAINE-EPINEPHRINE 0.25% -1:200000 IJ SOLN
INTRAMUSCULAR | Status: DC | PRN
  Administered 2020-11-21: 30 mL

## 2020-11-21 MED ORDER — LACTATED RINGERS IV SOLN: INTRAVENOUS | Status: DC

## 2020-11-21 MED ORDER — LIDOCAINE 2% (20 MG/ML) 5 ML SYRINGE
INTRAMUSCULAR | Status: AC
  Filled 2020-11-21: qty 5

## 2020-11-21 MED ORDER — ROCURONIUM BROMIDE 10 MG/ML (PF) SYRINGE
PREFILLED_SYRINGE | INTRAVENOUS | Status: DC | PRN
  Administered 2020-11-21: 50 mg via INTRAVENOUS

## 2020-11-21 MED ORDER — MELATONIN 5 MG PO TABS
5.0000 mg | ORAL_TABLET | Freq: Every evening | ORAL | Status: DC | PRN
  Administered 2020-11-22: 5 mg via ORAL
  Filled 2020-11-21: qty 1

## 2020-11-21 MED ORDER — SODIUM CHLORIDE 0.9 % IV SOLN: INTRAVENOUS | Status: DC

## 2020-11-21 MED ORDER — KETOROLAC TROMETHAMINE 30 MG/ML IJ SOLN
INTRAMUSCULAR | Status: AC
  Filled 2020-11-21: qty 1

## 2020-11-21 MED ORDER — BUPIVACAINE-EPINEPHRINE (PF) 0.25% -1:200000 IJ SOLN
INTRAMUSCULAR | Status: AC
  Filled 2020-11-21: qty 30

## 2020-11-21 MED ORDER — PROPOFOL 10 MG/ML IV BOLUS
INTRAVENOUS | Status: DC | PRN
Start: 2020-11-21 — End: 2020-11-21
  Administered 2020-11-21: 200 mg via INTRAVENOUS

## 2020-11-21 MED ORDER — ROCURONIUM BROMIDE 10 MG/ML (PF) SYRINGE
PREFILLED_SYRINGE | INTRAVENOUS | Status: AC
  Filled 2020-11-21: qty 10

## 2020-11-21 MED ORDER — 0.9 % SODIUM CHLORIDE (POUR BTL) OPTIME
TOPICAL | Status: DC | PRN
  Administered 2020-11-21: 1000 mL

## 2020-11-21 MED ORDER — ACETAMINOPHEN 500 MG PO TABS: 1000.0000 mg | ORAL_TABLET | ORAL | Status: DC

## 2020-11-21 MED ORDER — OXYCODONE HCL 5 MG/5ML PO SOLN: 5.0000 mg | Freq: Once | ORAL | Status: AC | PRN

## 2020-11-21 MED ORDER — HYDROMORPHONE HCL 1 MG/ML IJ SOLN
0.5000 mg | INTRAMUSCULAR | Status: DC | PRN
  Administered 2020-11-21: 1 mg via INTRAVENOUS
  Filled 2020-11-21: qty 1

## 2020-11-21 MED ORDER — PHENYLEPHRINE 40 MCG/ML (10ML) SYRINGE FOR IV PUSH (FOR BLOOD PRESSURE SUPPORT)
PREFILLED_SYRINGE | INTRAVENOUS | Status: AC
  Filled 2020-11-21: qty 10

## 2020-11-21 MED ORDER — OXYCODONE HCL 5 MG PO TABS
5.0000 mg | ORAL_TABLET | ORAL | Status: DC | PRN
Start: 2020-11-21 — End: 2020-11-22
  Administered 2020-11-21 – 2020-11-22 (×2): 5 mg via ORAL
  Filled 2020-11-21 (×3): qty 1

## 2020-11-21 MED ORDER — FENTANYL CITRATE (PF) 100 MCG/2ML IJ SOLN
INTRAMUSCULAR | Status: AC
  Filled 2020-11-21: qty 2

## 2020-11-21 MED ORDER — FENTANYL CITRATE (PF) 100 MCG/2ML IJ SOLN
25.0000 ug | INTRAMUSCULAR | Status: DC | PRN
  Administered 2020-11-21 (×3): 25 ug via INTRAVENOUS
  Administered 2020-11-21: 50 ug via INTRAVENOUS
  Administered 2020-11-21: 25 ug via INTRAVENOUS

## 2020-11-21 MED ORDER — IOHEXOL 300 MG/ML  SOLN
INTRAMUSCULAR | Status: DC | PRN
Start: 2020-11-21 — End: 2020-11-21
  Administered 2020-11-21: 10 mL

## 2020-11-21 MED ORDER — CHLORHEXIDINE GLUCONATE 0.12 % MT SOLN: 15.0000 mL | Freq: Once | OROMUCOSAL | Status: DC

## 2020-11-21 MED ORDER — AMISULPRIDE (ANTIEMETIC) 5 MG/2ML IV SOLN: 10.0000 mg | Freq: Once | INTRAVENOUS | Status: DC | PRN

## 2020-11-21 MED ORDER — ONDANSETRON HCL 4 MG/2ML IJ SOLN: 4.0000 mg | Freq: Once | INTRAMUSCULAR | Status: DC | PRN

## 2020-11-21 MED ORDER — MIDAZOLAM HCL 2 MG/2ML IJ SOLN
INTRAMUSCULAR | Status: AC
  Filled 2020-11-21: qty 2

## 2020-11-21 MED ORDER — SUGAMMADEX SODIUM 200 MG/2ML IV SOLN
INTRAVENOUS | Status: DC | PRN
  Administered 2020-11-21: 150 mg via INTRAVENOUS

## 2020-11-21 MED ORDER — CHLORHEXIDINE GLUCONATE CLOTH 2 % EX PADS
6.0000 | MEDICATED_PAD | Freq: Once | CUTANEOUS | Status: AC
  Administered 2020-11-21: 6 via TOPICAL

## 2020-11-21 MED ORDER — OXYCODONE HCL 5 MG PO TABS
5.0000 mg | ORAL_TABLET | Freq: Once | ORAL | Status: AC | PRN
  Administered 2020-11-21: 5 mg via ORAL

## 2020-11-21 MED ORDER — ONDANSETRON HCL 4 MG/2ML IJ SOLN
INTRAMUSCULAR | Status: DC | PRN
  Administered 2020-11-21: 4 mg via INTRAVENOUS

## 2020-11-21 MED ORDER — OXYCODONE HCL 5 MG PO TABS
ORAL_TABLET | ORAL | Status: AC
  Filled 2020-11-21: qty 1

## 2020-11-21 MED ORDER — MIDAZOLAM HCL 2 MG/2ML IJ SOLN
INTRAMUSCULAR | Status: DC | PRN
  Administered 2020-11-21: 2 mg via INTRAVENOUS

## 2020-11-21 MED ORDER — ACETAMINOPHEN 500 MG PO TABS
1000.0000 mg | ORAL_TABLET | Freq: Four times a day (QID) | ORAL | Status: DC
  Administered 2020-11-21 – 2020-11-22 (×3): 1000 mg via ORAL
  Filled 2020-11-21 (×3): qty 2

## 2020-11-21 MED ORDER — DEXAMETHASONE SODIUM PHOSPHATE 10 MG/ML IJ SOLN
INTRAMUSCULAR | Status: DC | PRN
  Administered 2020-11-21: 10 mg via INTRAVENOUS

## 2020-11-21 MED ORDER — ONDANSETRON HCL 4 MG/2ML IJ SOLN
INTRAMUSCULAR | Status: AC
  Filled 2020-11-21: qty 2

## 2020-11-21 MED ORDER — LIDOCAINE 2% (20 MG/ML) 5 ML SYRINGE
INTRAMUSCULAR | Status: DC | PRN
  Administered 2020-11-21: 60 mg via INTRAVENOUS

## 2020-11-21 MED ORDER — DEXAMETHASONE SODIUM PHOSPHATE 10 MG/ML IJ SOLN
INTRAMUSCULAR | Status: AC
  Filled 2020-11-21: qty 1

## 2020-11-21 MED ORDER — KETOROLAC TROMETHAMINE 30 MG/ML IJ SOLN
30.0000 mg | Freq: Once | INTRAMUSCULAR | Status: AC
  Administered 2020-11-21: 30 mg via INTRAVENOUS

## 2020-11-21 MED ORDER — FENTANYL CITRATE (PF) 250 MCG/5ML IJ SOLN
INTRAMUSCULAR | Status: DC | PRN
  Administered 2020-11-21: 50 ug via INTRAVENOUS
  Administered 2020-11-21: 100 ug via INTRAVENOUS
  Administered 2020-11-21 (×2): 50 ug via INTRAVENOUS

## 2020-11-21 MED ORDER — LACTATED RINGERS IV SOLN
INTRAVENOUS | Status: AC | PRN
  Administered 2020-11-21: 1000 mL

## 2020-11-21 MED ORDER — FENTANYL CITRATE (PF) 250 MCG/5ML IJ SOLN
INTRAMUSCULAR | Status: AC
  Filled 2020-11-21: qty 5

## 2020-11-21 SURGICAL SUPPLY — 53 items
APPLICATOR COTTON TIP 6 STRL (MISCELLANEOUS) IMPLANT
APPLICATOR COTTON TIP 6IN STRL (MISCELLANEOUS)
APPLIER CLIP 5 13 M/L LIGAMAX5 (MISCELLANEOUS) ×2
BLADE SURG 15 STRL LF DISP TIS (BLADE) ×1 IMPLANT
BLADE SURG 15 STRL SS (BLADE) ×1
BLADE SURG SZ11 CARB STEEL (BLADE) ×2 IMPLANT
CABLE HIGH FREQUENCY MONO STRZ (ELECTRODE) IMPLANT
CHLORAPREP W/TINT 26 (MISCELLANEOUS) ×2 IMPLANT
CLIP APPLIE 5 13 M/L LIGAMAX5 (MISCELLANEOUS) ×1 IMPLANT
COVER MAYO STAND STRL (DRAPES) IMPLANT
COVER SURGICAL LIGHT HANDLE (MISCELLANEOUS) ×2 IMPLANT
COVER WAND RF STERILE (DRAPES) IMPLANT
DECANTER SPIKE VIAL GLASS SM (MISCELLANEOUS) ×2 IMPLANT
DERMABOND ADVANCED (GAUZE/BANDAGES/DRESSINGS) ×1
DERMABOND ADVANCED .7 DNX12 (GAUZE/BANDAGES/DRESSINGS) ×1 IMPLANT
DRAPE 3/4 80X56 (DRAPES) ×2 IMPLANT
DRAPE C-ARM 42X120 X-RAY (DRAPES) ×2 IMPLANT
DRAPE UTILITY XL STRL (DRAPES) ×2 IMPLANT
ELECT L-HOOK LAP 45CM DISP (ELECTROSURGICAL)
ELECT PENCIL ROCKER SW 15FT (MISCELLANEOUS) ×2 IMPLANT
ELECT REM PT RETURN 15FT ADLT (MISCELLANEOUS) ×2 IMPLANT
ELECTRODE L-HOOK LAP 45CM DISP (ELECTROSURGICAL) IMPLANT
GAUZE 4X4 16PLY RFD (DISPOSABLE) ×2 IMPLANT
GLOVE SURG POLYISO LF SZ5.5 (GLOVE) ×2 IMPLANT
GLOVE SURG UNDER POLY LF SZ6 (GLOVE) ×2 IMPLANT
GOWN STRL REUS W/TWL LRG LVL3 (GOWN DISPOSABLE) ×2 IMPLANT
GOWN STRL REUS W/TWL XL LVL3 (GOWN DISPOSABLE) ×4 IMPLANT
GRASPER SUT TROCAR 14GX15 (MISCELLANEOUS) IMPLANT
HEMOSTAT SNOW SURGICEL 2X4 (HEMOSTASIS) IMPLANT
KIT BASIN OR (CUSTOM PROCEDURE TRAY) ×2 IMPLANT
KIT TURNOVER KIT A (KITS) ×2 IMPLANT
L-HOOK LAP DISP 36CM (ELECTROSURGICAL) ×2
LHOOK LAP DISP 36CM (ELECTROSURGICAL) ×1 IMPLANT
NEEDLE HYPO 22GX1.5 SAFETY (NEEDLE) ×2 IMPLANT
NEEDLE INSUFFLATION 14GA 120MM (NEEDLE) IMPLANT
PACK UNIVERSAL I (CUSTOM PROCEDURE TRAY) ×2 IMPLANT
POUCH SPECIMEN RETRIEVAL 10MM (ENDOMECHANICALS) ×2 IMPLANT
SCISSORS LAP 5X35 DISP (ENDOMECHANICALS) ×2 IMPLANT
SET CHOLANGIOGRAPH MIX (MISCELLANEOUS) ×2 IMPLANT
SET IRRIG TUBING LAPAROSCOPIC (IRRIGATION / IRRIGATOR) ×2 IMPLANT
SET TUBE SMOKE EVAC HIGH FLOW (TUBING) ×2 IMPLANT
SLEEVE XCEL OPT CAN 5 100 (ENDOMECHANICALS) ×4 IMPLANT
SOL ANTI FOG 6CC (MISCELLANEOUS) ×1 IMPLANT
SOLUTION ANTI FOG 6CC (MISCELLANEOUS) ×1
SUT MNCRL AB 4-0 PS2 18 (SUTURE) ×2 IMPLANT
SUT VICRYL 0 UR6 27IN ABS (SUTURE) ×2 IMPLANT
SYR 20ML LL LF (SYRINGE) IMPLANT
SYR CONTROL 10ML LL (SYRINGE) ×2 IMPLANT
TOWEL OR 17X26 10 PK STRL BLUE (TOWEL DISPOSABLE) ×2 IMPLANT
TOWEL OR NON WOVEN STRL DISP B (DISPOSABLE) IMPLANT
TROCAR BLADELESS OPT 5 100 (ENDOMECHANICALS) ×2 IMPLANT
TROCAR XCEL 12X100 BLDLESS (ENDOMECHANICALS) IMPLANT
TROCAR XCEL BLUNT TIP 100MML (ENDOMECHANICALS) IMPLANT

## 2020-11-21 NOTE — Progress Notes (Signed)
Received report from [place] for surgery today.

## 2020-11-21 NOTE — Op Note (Signed)
Date: 11/21/20  Patient: Julie Anderson MRN: [id number]   Preoperative Diagnosis: Gallstone pancreatitis Postoperative Diagnosis: Same  Procedure: Laparoscopic cholecystectomy with intraoperative cholangiogram  Surgeon: Michaelle Birks, MD Assistant: Saverio Danker, PA-C  EBL: Minimal  Anesthesia: General  Specimens: Gallbladder  Indications: Ms. Peplinski is a 25 yo female who presented with upper abdominal pain and mild elevation in LFTs and elevated lipase. RUQ US showed gallstones. Her abdominal pain has improved. Cholecystectomy was recommended.  Findings: Cholelithiasis, no evidence of acute cholecystitis. Normal cholangiogram.  Procedure details: Informed consent was obtained in the preoperative area prior to the procedure. The patient was brought to the operating room and placed on the table in the supine position. General anesthesia was induced and appropriate lines and drains were placed for intraoperative monitoring. Perioperative antibiotics were administered per SCIP guidelines. The abdomen was prepped and draped in the usual sterile fashion. A pre-procedure timeout was taken verifying patient identity, surgical site and procedure to be performed.   A small infraumbilical skin incision was made, the subcutaneous tissue was divided with cautery, and the umbilical stalk was grasped and elevated. The fascia was incised and the peritoneal cavity was directly visualized. A 29mm Hassan trocar was placed. The peritoneal cavity was inspected with no evidence of visceral or vascular injury. Three 58mm ports were placed in the right subcostal margin, all under direct visualization. The fundus of the gallbladder was grasped and retracted cephalad. The infundibulum was retracted laterally. The cystic triangle was dissected out using cautery and blunt dissection, and the critical view of safety was obtained. The cystic artery was small and was divided with cautery. The cystic duct was partially divided  and a cholangiocatheter was passed. A cholangiogram was performed with fluoroscopy and demonstrated filling of the entire common bile duct, common hepatic duct, and left and right hepatic ducts. There was filling of the duodenum. There were no filling defects in the biliary tree. The cholangiocatheter was removed, two clips were placed on the cystic duct beyond the ductotomy, and the duct was completely transected. The gallbladder was taken off the liver using cautery. The specimen was placed in an endocatch bag and removed. The surgical site was irrigated with saline until the effluent was clear. Hemostasis was achieved in the gallbladder fossa using cautery. The cystic duct and artery stumps were visually inspected and there was no evidence of bile leak or bleeding. The ports were removed under direct visualization and the abdomen was desufflated. The umbilical port site fascia was closed with a 0 vicryl suture. The skin at all port sites was closed with 4-0 monocryl subcuticular suture. Dermabond was applied.  The patient tolerated the procedure well with no apparent complications. All counts were correct x2 at the end of the procedure. The patient was extubated and taken to PACU in stable condition.  Michaelle Birks, MD 11/21/20 12:32 PM

## 2020-11-21 NOTE — Progress Notes (Signed)
Patient is progressing well, regular diet tolerating well with no nausea or complications. Ambulated around the entire unit with minimal assist. Tolerated well and says it helps her feel better. Voided without difficulty. Sat in chair with feet elevated for several hours post op. In bed now with ice pack and pillow to abd. Call bell within reach.

## 2020-11-21 NOTE — Discharge Instructions (Signed)
[facility]  Please arrive at least 30 min before your appointment to complete your check in paperwork.  If you are unable to arrive 30 min prior to your appointment time we may have to cancel or reschedule you. LAPAROSCOPIC SURGERY: POST OP INSTRUCTIONS Always review your discharge instruction sheet given to you by the facility where your surgery was performed. IF YOU HAVE DISABILITY OR FAMILY LEAVE FORMS, YOU MUST BRING THEM TO THE OFFICE FOR PROCESSING.   DO NOT GIVE THEM TO YOUR DOCTOR.  PAIN CONTROL  1. First take acetaminophen (Tylenol) AND/or ibuprofen (Advil) to control your pain after surgery.  Follow directions on package.  Taking acetaminophen (Tylenol) and/or ibuprofen (Advil) regularly after surgery will help to control your pain and lower the amount of prescription pain medication you may need.  You should not take more than 4,000 mg (4 grams) of acetaminophen (Tylenol) in 24 hours.  You should not take ibuprofen (Advil), aleve, motrin, naprosyn or other NSAIDS if you have a history of stomach ulcers or chronic kidney disease.  2. A prescription for pain medication may be given to you upon discharge.  Take your pain medication as prescribed, if you still have uncontrolled pain after taking acetaminophen (Tylenol) or ibuprofen (Advil). 3. Use ice packs to help control pain. 4. If you need a refill on your pain medication, please contact your pharmacy.  They will contact our office to request authorization. Prescriptions will not be filled after 5pm or on week-ends.  HOME MEDICATIONS 5. Take your usually prescribed medications unless otherwise directed.  DIET 6. You should follow a light diet the first few days after arrival home.  Be sure to include lots of fluids daily. Avoid fatty, fried foods.   CONSTIPATION 7. It is common to experience some constipation after surgery and if you are taking pain medication.  Increasing fluid intake and taking a stool  softener (such as Colace) will usually help or prevent this problem from occurring.  A mild laxative (Milk of Magnesia or Miralax) should be taken according to package instructions if there are no bowel movements after 48 hours.  WOUND/INCISION CARE 8. Most patients will experience some swelling and bruising in the area of the incisions.  Ice packs will help.  Swelling and bruising can take several days to resolve.  9. Unless discharge instructions indicate otherwise, follow guidelines below  a. STERI-STRIPS - you may remove your outer bandages 48 hours after surgery, and you may shower at that time.  You have steri-strips (small skin tapes) in place directly over the incision.  These strips should be left on the skin for 7-10 days.   b. DERMABOND/SKIN GLUE - you may shower in 24 hours.  The glue will flake off over the next 2-3 weeks. 10. Any sutures or staples will be removed at the office during your follow-up visit.  ACTIVITIES 11. You may resume regular (light) daily activities beginning the next day--such as daily self-care, walking, climbing stairs--gradually increasing activities as tolerated.  You may have sexual intercourse when it is comfortable.  Refrain from any heavy lifting or straining until approved by your doctor. a. You may drive when you are no longer taking prescription pain medication, you can comfortably wear a seatbelt, and you can safely maneuver your car and apply brakes.  FOLLOW-UP 12. You should see your doctor in the office for a follow-up appointment approximately 2-3 weeks after your surgery.  You should have been given your post-op/follow-up appointment when   your surgery was scheduled.  If you did not receive a post-op/follow-up appointment, make sure that you call for this appointment within a day or two after you arrive home to insure a convenient appointment time.   WHEN TO CALL YOUR DOCTOR: 1. Fever over 101.0 2. Inability to urinate 3. Continued bleeding from  incision. 4. Increased pain, redness, or drainage from the incision. 5. Increasing abdominal pain  The clinic staff is available to answer your questions during regular business hours.  Please don't hesitate to call and ask to speak to one of the nurses for clinical concerns.  If you have a medical emergency, go to the nearest emergency room or call 911.  A surgeon from [facility] is always on call at the hospital. [address], [address], [place], [place]  [address] ? [address] [address], [place], [place]   [address] [phone] ? [phone] ? FAX [phone]  .........   Managing Your Pain After Surgery Without Opioids    Thank you for participating in our program to help patients manage their pain after surgery without opioids. This is part of our effort to provide you with the best care possible, without exposing you or your family to the risk that opioids pose.  What pain can I expect after surgery? You can expect to have some pain after surgery. This is normal. The pain is typically worse the day after surgery, and quickly begins to get better. Many studies have found that many patients are able to manage their pain after surgery with Over-the-Counter (OTC) medications such as Tylenol and Motrin. If you have a condition that does not allow you to take Tylenol or Motrin, notify your surgical team.  How will I manage my pain? The best strategy for controlling your pain after surgery is around the clock pain control with Tylenol (acetaminophen) and Motrin (ibuprofen or Advil). Alternating these medications with each other allows you to maximize your pain control. In addition to Tylenol and Motrin, you can use heating pads or ice packs on your incisions to help reduce your pain.  How will I alternate your regular strength over-the-counter pain medication? You will take a dose of pain medication every three hours. ; Start by taking 650 mg of Tylenol (2 pills of 325  mg) ; 3 hours later take 600 mg of Motrin (3 pills of 200 mg) ; 3 hours after taking the Motrin take 650 mg of Tylenol ; 3 hours after that take 600 mg of Motrin.   - 1 -  See example - if your first dose of Tylenol is at 12:00 PM   12:00 PM Tylenol 650 mg (2 pills of 325 mg)  3:00 PM Motrin 600 mg (3 pills of 200 mg)  6:00 PM Tylenol 650 mg (2 pills of 325 mg)  9:00 PM Motrin 600 mg (3 pills of 200 mg)  Continue alternating every 3 hours   We recommend that you follow this schedule around-the-clock for at least 3 days after surgery, or until you feel that it is no longer needed. Use the table on the last page of this handout to keep track of the medications you are taking. Important: Do not take more than 3000mg of Tylenol or 3200mg of Motrin in a 24-hour period. Do not take ibuprofen/Motrin if you have a history of bleeding stomach ulcers, severe kidney disease, &/or actively taking a blood thinner  What if I still have pain? If you have pain that is not   controlled with the over-the-counter pain medications (Tylenol and Motrin or Advil) you might have what we call "breakthrough" pain. You will receive a prescription for a small amount of an opioid pain medication such as Oxycodone, Tramadol, or Tylenol with Codeine. Use these opioid pills in the first 24 hours after surgery if you have breakthrough pain. Do not take more than 1 pill every 4-6 hours.  If you still have uncontrolled pain after using all opioid pills, don't hesitate to call our staff using the number provided. We will help make sure you are managing your pain in the best way possible, and if necessary, we can provide a prescription for additional pain medication.   Day 1    Time  Name of Medication Number of pills taken  Amount of Acetaminophen  Pain Level   Comments  AM PM       AM PM       AM PM       AM PM       AM PM       AM PM       AM PM       AM PM       Total Daily amount of Acetaminophen Do not  take more than  3,000 mg per day      Day 2    Time  Name of Medication Number of pills taken  Amount of Acetaminophen  Pain Level   Comments  AM PM       AM PM       AM PM       AM PM       AM PM       AM PM       AM PM       AM PM       Total Daily amount of Acetaminophen Do not take more than  3,000 mg per day      Day 3    Time  Name of Medication Number of pills taken  Amount of Acetaminophen  Pain Level   Comments  AM PM       AM PM       AM PM       AM PM          AM PM       AM PM       AM PM       AM PM       Total Daily amount of Acetaminophen Do not take more than  3,000 mg per day      Day 4    Time  Name of Medication Number of pills taken  Amount of Acetaminophen  Pain Level   Comments  AM PM       AM PM       AM PM       AM PM       AM PM       AM PM       AM PM       AM PM       Total Daily amount of Acetaminophen Do not take more than  3,000 mg per day      Day 5    Time  Name of Medication Number of pills taken  Amount of Acetaminophen  Pain Level   Comments  AM PM       AM PM       AM   PM       AM PM       AM PM       AM PM       AM PM       AM PM       Total Daily amount of Acetaminophen Do not take more than  3,000 mg per day       Day 6    Time  Name of Medication Number of pills taken  Amount of Acetaminophen  Pain Level  Comments  AM PM       AM PM       AM PM       AM PM       AM PM       AM PM       AM PM       AM PM       Total Daily amount of Acetaminophen Do not take more than  3,000 mg per day      Day 7    Time  Name of Medication Number of pills taken  Amount of Acetaminophen  Pain Level   Comments  AM PM       AM PM       AM PM       AM PM       AM PM       AM PM       AM PM       AM PM       Total Daily amount of Acetaminophen Do not take more than  3,000 mg per day        For additional information about how and where to safely dispose of unused  opioid medications - https://www.morepowerfulnc.org  Disclaimer: This document contains information and/or instructional materials adapted from [facility] for the typical patient with your condition. It does not replace medical advice from your health care provider because your experience may differ from that of the typical patient. Talk to your health care provider if you have any questions about this document, your condition or your treatment plan. Adapted from [facility]   

## 2020-11-21 NOTE — Anesthesia Postprocedure Evaluation (Signed)
Anesthesia Post Note  Patient: Antonietta Breach  Procedure(s) Performed: LAPAROSCOPIC CHOLECYSTECTOMY WITH INTRAOPERATIVE CHOLANGIOGRAM (N/A )     Patient location during evaluation: PACU Anesthesia Type: General Level of consciousness: awake and alert Pain management: pain level controlled Vital Signs Assessment: post-procedure vital signs reviewed and stable Respiratory status: spontaneous breathing, nonlabored ventilation and respiratory function stable Cardiovascular status: blood pressure returned to baseline and stable Postop Assessment: no apparent nausea or vomiting Anesthetic complications: no   No complications documented.  Last Vitals:  Vitals:   11/21/20 1315 11/21/20 1330  BP: (!) 101/56 99/61  Pulse: 64 65  Resp: 11 18  Temp:    SpO2: 100% 100%    Last Pain:  Vitals:   11/21/20 1330  TempSrc:   PainSc: 0-No pain                 Lidia Collum

## 2020-11-21 NOTE — Progress Notes (Signed)
Day of Surgery  Subjective: Pain much improved. Tbili normal, LFTs downtrending. Afebrile, vitals stable.  ROS: See above, otherwise other systems negative  Objective: Vital signs in last 24 hours: Temp:  [98.4 F (36.9 C)-98.8 F (37.1 C)] 98.5 F (36.9 C) (03/25 0556) Pulse Rate:  [58-70] 62 (03/25 0556) Resp:  [16] 16 (03/25 0556) BP: (94-102)/(50-61) 102/60 (03/25 0556) SpO2:  [97 %-100 %] 97 % (03/25 0556) Last BM Date: 11/18/20  Intake/Output from previous day: 03/24 0701 - 03/25 0700 In: 2735.4 [I.V.:2735.4] Out: 2850 [Urine:2850] Intake/Output this shift: No intake/output data recorded.  PE: General: resting comfortably, NAD Neuro: alert and oriented Resp: normal work of breathing on room air Abdomen: soft, nondistended, nontender to palpation. No surgical scars. Extremities: warm and well-perfused  Lab Results:  Recent Labs    11/20/20 0457 11/21/20 0236  WBC 5.5 4.4  HGB 12.3 12.1  HCT 37.1 37.2  PLT 272 274   BMET Recent Labs    11/20/20 0457 11/21/20 0236  NA 138 141  K 3.8 3.9  CL 108 112*  CO2 20* 22  GLUCOSE 99 95  BUN 15 7  CREATININE 0.71 0.70  CALCIUM 8.7* 8.9   PT/INR No results for input(s): LABPROT, INR in the last 72 hours. CMP     Component Value Date/Time   NA 141 11/21/2020 0236   K 3.9 11/21/2020 0236   CL 112 (H) 11/21/2020 0236   CO2 22 11/21/2020 0236   GLUCOSE 95 11/21/2020 0236   BUN 7 11/21/2020 0236   CREATININE 0.70 11/21/2020 0236   CREATININE 0.69 09/07/2020 1512   CALCIUM 8.9 11/21/2020 0236   PROT 6.6 11/21/2020 0236   ALBUMIN 3.6 11/21/2020 0236   AST 57 (H) 11/21/2020 0236   ALT 96 (H) 11/21/2020 0236   ALKPHOS 88 11/21/2020 0236   BILITOT 0.8 11/21/2020 0236   GFRNONAA >60 11/21/2020 0236   GFRNONAA 123 09/07/2020 1512   GFRAA 142 09/07/2020 1512   Lipase     Component Value Date/Time   LIPASE 174 (H) 11/20/2020 0457       Studies/Results: US Abdomen Limited RUQ  (LIVER/GB)  Result Date: 11/19/2020 CLINICAL DATA:  Right upper quadrant pain EXAM: ULTRASOUND ABDOMEN LIMITED RIGHT UPPER QUADRANT COMPARISON:  Ultrasound 11/13/2020 FINDINGS: Gallbladder: Redemonstration of the tiny layering, shadowing gallstones within the gallbladder lumen, likely a mixture of biliary stones/biliary sand. No abnormal gallbladder wall thickening. No pericholecystic fluid. Sonographic Percell Miller sign is reportedly negative. Common bile duct: Diameter: 4 mm, nondilated Liver: Mildly increased hepatic echogenicity with loss of definition of the portal triads. No focal liver lesion. Portal vein is patent on color Doppler imaging with normal direction of blood flow towards the liver. Other: None. IMPRESSION: 1. Cholelithiasis without sonographic evidence of acute cholecystitis. 2. Likely mild hepatic steatosis. Electronically Signed   By: Lovena Le M.D.   On: 11/19/2020 23:59    Anti-infectives: Anti-infectives (From admission, onward)   Start     Dose/Rate Route Frequency Ordered Stop   11/21/20 1145  cefTRIAXone (ROCEPHIN) 2 g in sodium chloride 0.9 % 100 mL IVPB        2 g 200 mL/hr over 30 Minutes Intravenous On call to O.R. 11/20/20 1334 11/22/20 0559       Assessment/Plan 25 yo female with gallstone pancreatitis. Pancreatitis symptoms resolved. - Proceed to OR today for lap cholecystectomy with intraop cholangiogram. Procedure details discussed with the patient. - NPO for OR, plan for regular diet postop - Periop  antibiotics, no indication for antibiotics otherwise    LOS: 1 day    Michaelle Birks, MD [facility] General, Hepatobiliary and Pancreatic Surgery 11/21/20 8:19 AM

## 2020-11-21 NOTE — Progress Notes (Signed)
Patient surgical incision at umbilicus oozing minimal amount of serosanguinous drainage upon arrival to room from PACU. Gauze applied to site and no further drainage noted.

## 2020-11-21 NOTE — Anesthesia Preprocedure Evaluation (Addendum)
Anesthesia Evaluation  Patient identified by MRN, date of birth, ID band Patient awake    Reviewed: Allergy & Precautions, NPO status , Patient's Chart, lab work & pertinent test results  History of Anesthesia Complications Negative for: history of anesthetic complications  Airway Mallampati: II  TM Distance: >3 FB Neck ROM: Full    Dental  (+) Teeth Intact Permanent bottom retainer:   Pulmonary neg pulmonary ROS, Patient abstained from smoking.,    Pulmonary exam normal        Cardiovascular negative cardio ROS Normal cardiovascular exam     Neuro/Psych Anxiety Depression negative neurological ROS     GI/Hepatic Neg liver ROS, Gallstone pancreatitis   Endo/Other  negative endocrine ROS  Renal/GU negative Renal ROS  negative genitourinary   Musculoskeletal negative musculoskeletal ROS (+)   Abdominal   Peds  (+) ATTENTION DEFICIT DISORDER WITHOUT HYPERACTIVITY Hematology negative hematology ROS (+)   Anesthesia Other Findings   Reproductive/Obstetrics                           Anesthesia Physical Anesthesia Plan  ASA: II  Anesthesia Plan: General   Post-op Pain Management:    Induction: Intravenous  PONV Risk Score and Plan: 4 or greater and Ondansetron, Dexamethasone, Treatment may vary due to age or medical condition, Midazolam and Scopolamine patch - Pre-op  Airway Management Planned: Oral ETT  Additional Equipment: None  Intra-op Plan:   Post-operative Plan: Extubation in OR  Informed Consent: I have reviewed the patients History and Physical, chart, labs and discussed the procedure including the risks, benefits and alternatives for the proposed anesthesia with the patient or authorized representative who has indicated his/her understanding and acceptance.     Dental advisory given  Plan Discussed with:   Anesthesia Plan Comments:        Anesthesia Quick  Evaluation

## 2020-11-21 NOTE — Anesthesia Procedure Notes (Signed)
Procedure Name: Intubation Date/Time: 11/21/2020 11:14 AM Performed by: Sharlette Dense, CRNA Patient Re-evaluated:Patient Re-evaluated prior to induction Oxygen Delivery Method: Circle system utilized Preoxygenation: Pre-oxygenation with 100% oxygen Induction Type: IV induction Ventilation: Mask ventilation without difficulty and Oral airway inserted - appropriate to patient size Laryngoscope Size: Sabra Heck and 2 Grade View: Grade I Tube type: Oral Tube size: 7.0 mm Number of attempts: 1 Airway Equipment and Method: Stylet Placement Confirmation: ETT inserted through vocal cords under direct vision,  positive ETCO2 and breath sounds checked- equal and bilateral Secured at: 21 cm Tube secured with: Tape Dental Injury: Teeth and Oropharynx as per pre-operative assessment

## 2020-11-21 NOTE — Progress Notes (Signed)
PROGRESS NOTE  Julie Anderson OQH:[id number] DOB: 11-Nov-1995 DOA: 11/19/2020 PCP: Binnie Rail, MD   LOS: 1 day   Brief narrative:  Patient is 25 years old female with history of ADD, anxiety, depression and gallstone presented to hospital with epigastric and right upper quadrant pain.  Patient had seen her family doctor and an ultrasound had shown gallstones without acute cholecystitis.  Plan was to follow-up with general surgery as outpatient but due to ongoing pain, patient presented to hospital.  In the ED, patient had cholelithiasis.  General surgery was consulted.  Initial lipase level over 1200.    During hospitalization, general surgery saw the patient.  Patient was then considered for laparoscopic cholecystectomy on 11/21/2020.  Assessment/Plan:  Principal Problem:   Pancreatitis Active Problems:   Acute cholecystitis   Acute gallstone pancreatitis.  Patient is post laparoscopic cholecystectomy 11/21/2020, mildly elevated LFTs on presentation.  Right upper quadrant ultrasound showed gallstones without cholecystitis. Lipase of 1212 on presentation.  Elevated total cholesterol, triglyceride of 251.  Hemoglobin A1c of 5.5.  Patient has been initiated on oral diet.  Hyperlipidemia.  Patient was counseled about it.  Will need lifestyle modification and dietary changes.  History of hyperhomocystinemia as per the family at bedside.   DVT prophylaxis: SCD's Start: 11/21/20 5465 Place TED hose Start: 11/20/20 0400    Code Status: Full code  Family Communication: Spoke with the parents at bedside  Status is: Inpatient  Remains inpatient appropriate because:IV treatments appropriate due to intensity of illness or inability to take PO and Inpatient level of care appropriate due to severity of illness   Dispo: The patient is from: Home              Anticipated d/c is to: Home              Patient currently is not medically stable to d/c.  Likely disposition home tomorrow as per  surgery   Difficult to place patient No  Consultants:  General surgery  Procedures:  Laparoscopic cholecystectomy on 11/21/2020  Anti-infectives:  . None  Anti-infectives (From admission, onward)   Start     Dose/Rate Route Frequency Ordered Stop   11/21/20 1145  [MAR Hold]  cefTRIAXone (ROCEPHIN) 2 g in sodium chloride 0.9 % 100 mL IVPB        (MAR Hold since Fri 11/21/2020 at 1029.Hold Reason: Transfer to a Procedural area.)   2 g 200 mL/hr over 30 Minutes Intravenous On call to O.R. 11/20/20 1334 11/21/20 1133       Subjective: Today, patient was seen and examined at bedside.  Seen after lap chole.  Denies any nausea or vomiting.  Trying to eat some food.  Objective: Vitals:   11/21/20 1230 11/21/20 1245  BP: 132/63 128/69  Pulse: (!) 122 (!) 109  Resp: 18 (!) 36  Temp:    SpO2: 100% 100%    Intake/Output Summary (Last 24 hours) at 11/21/2020 1302 Last data filed at 11/21/2020 1228 Gross per 24 hour  Intake 4100.79 ml  Output 2875 ml  Net 1225.79 ml   Filed Weights   11/19/20 2214  Weight: 63.5 kg   Body mass index is 27.8 kg/m.   Physical Exam:  GENERAL: Patient is alert awake and oriented. Not in obvious distress.  Overweight HENT: No scleral pallor or icterus. Pupils equally reactive to light. Oral mucosa is moist NECK: is supple, no gross swelling noted. CHEST: Clear to auscultation. No crackles or wheezes.  Diminished breath sounds  bilaterally. CVS: S1 and S2 heard, no murmur. Regular rate and rhythm.  ABDOMEN: Status post laparoscopic cholecystectomy. EXTREMITIES: No edema. CNS: Cranial nerves are intact. No focal motor deficits. SKIN: warm and dry without rashes.  Data Review: I have personally reviewed the following laboratory data and studies,  CBC: Recent Labs  Lab 11/19/20 2217 11/20/20 0457 11/21/20 0236  WBC 9.1 5.5 4.4  HGB 13.4 12.3 12.1  HCT 40.3 37.1 37.2  MCV 92.4 93.5 94.7  PLT 331 272 676   Basic Metabolic Panel: Recent  Labs  Lab 11/19/20 2217 11/20/20 0457 11/21/20 0236  NA 142 138 141  K 4.0 3.8 3.9  CL 110 108 112*  CO2 21* 20* 22  GLUCOSE 123* 99 95  BUN 16 15 7   CREATININE 0.85 0.71 0.70  CALCIUM 9.6 8.7* 8.9  MG  --   --  2.1  PHOS  --   --  4.3   Liver Function Tests: Recent Labs  Lab 11/19/20 2217 11/20/20 0457 11/21/20 0236  AST 175* 189* 57*  ALT 108* 158* 96*  ALKPHOS 115 110 88  BILITOT 1.2 0.7 0.8  PROT 8.0 7.1 6.6  ALBUMIN 4.1 3.8 3.6   Recent Labs  Lab 11/19/20 2217 11/20/20 0457  LIPASE 1,212* 174*   No results for input(s): AMMONIA in the last 168 hours. Cardiac Enzymes: No results for input(s): CKTOTAL, CKMB, CKMBINDEX, TROPONINI in the last 168 hours. BNP (last 3 results) No results for input(s): BNP in the last 8760 hours.  ProBNP (last 3 results) No results for input(s): PROBNP in the last 8760 hours.  CBG: No results for input(s): GLUCAP in the last 168 hours. Recent Results (from the past 240 hour(s))  Resp Panel by RT-PCR (Flu A&B, Covid) Nasopharyngeal Swab     Status: None   Collection Time: 11/20/20  1:27 AM   Specimen: Nasopharyngeal Swab; Nasopharyngeal(NP) swabs in vial transport medium  Result Value Ref Range Status   SARS Coronavirus 2 by RT PCR NEGATIVE NEGATIVE Final    Comment: (NOTE) SARS-CoV-2 target nucleic acids are NOT DETECTED.  The SARS-CoV-2 RNA is generally detectable in upper respiratory specimens during the acute phase of infection. The lowest concentration of SARS-CoV-2 viral copies this assay can detect is 138 copies/mL. A negative result does not preclude SARS-Cov-2 infection and should not be used as the sole basis for treatment or other patient management decisions. A negative result may occur with  improper specimen collection/handling, submission of specimen other than nasopharyngeal swab, presence of viral mutation(s) within the areas targeted by this assay, and inadequate number of viral copies(<138 copies/mL). A  negative result must be combined with clinical observations, patient history, and epidemiological information. The expected result is Negative.  Fact Sheet for Patients:  EntrepreneurPulse.com.au  Fact Sheet for Healthcare Providers:  IncredibleEmployment.be  This test is no t yet approved or cleared by the [place] FDA and  has been authorized for detection and/or diagnosis of SARS-CoV-2 by FDA under an Emergency Use Authorization (EUA). This EUA will remain  in effect (meaning this test can be used) for the duration of the COVID-19 declaration under Section 564(b)(1) of the Act, 21 U.S.C.section 360bbb-3(b)(1), unless the authorization is terminated  or revoked sooner.       Influenza A by PCR NEGATIVE NEGATIVE Final   Influenza B by PCR NEGATIVE NEGATIVE Final    Comment: (NOTE) The Xpert Xpress SARS-CoV-2/FLU/RSV plus assay is intended as an aid in the diagnosis of influenza from Nasopharyngeal  swab specimens and should not be used as a sole basis for treatment. Nasal washings and aspirates are unacceptable for Xpert Xpress SARS-CoV-2/FLU/RSV testing.  Fact Sheet for Patients: EntrepreneurPulse.com.au  Fact Sheet for Healthcare Providers: IncredibleEmployment.be  This test is not yet approved or cleared by the [place] FDA and has been authorized for detection and/or diagnosis of SARS-CoV-2 by FDA under an Emergency Use Authorization (EUA). This EUA will remain in effect (meaning this test can be used) for the duration of the COVID-19 declaration under Section 564(b)(1) of the Act, 21 U.S.C. section 360bbb-3(b)(1), unless the authorization is terminated or revoked.  Performed at [facility], [place] [address], [place], [place] [address]   Surgical PCR screen     Status: Abnormal   Collection Time: 11/20/20  8:40 PM   Specimen: Nasal Mucosa; Nasal Swab  Result Value Ref  Range Status   MRSA, PCR NEGATIVE NEGATIVE Final   Staphylococcus aureus POSITIVE (A) NEGATIVE Final    Comment: (NOTE) The Xpert SA Assay (FDA approved for NASAL specimens in patients 17 years of age and older), is one component of a comprehensive surveillance program. It is not intended to diagnose infection nor to guide or monitor treatment. Performed at [facility], [place] [address] [place], [place] [address]      Studies: DG Cholangiogram Operative  Result Date: 11/21/2020 CLINICAL DATA:  Cholelithiasis EXAM: INTRAOPERATIVE CHOLANGIOGRAM TECHNIQUE: Cholangiographic images from the C-arm fluoroscopic device were submitted for interpretation post-operatively. Please see the procedural report for the amount of contrast and the fluoroscopy time utilized. COMPARISON:  Ultrasound 11/19/2020 FINDINGS: No persistent filling defects in the common duct. Intrahepatic ducts are incompletely visualized, appearing decompressed centrally. Contrast passes into the duodenum. : Negative for retained common duct stone. Or Electronically Signed   By: Lucrezia Europe M.D.   On: 11/21/2020 12:17   US Abdomen Limited RUQ (LIVER/GB)  Result Date: 11/19/2020 CLINICAL DATA:  Right upper quadrant pain EXAM: ULTRASOUND ABDOMEN LIMITED RIGHT UPPER QUADRANT COMPARISON:  Ultrasound 11/13/2020 FINDINGS: Gallbladder: Redemonstration of the tiny layering, shadowing gallstones within the gallbladder lumen, likely a mixture of biliary stones/biliary sand. No abnormal gallbladder wall thickening. No pericholecystic fluid. Sonographic Percell Miller sign is reportedly negative. Common bile duct: Diameter: 4 mm, nondilated Liver: Mildly increased hepatic echogenicity with loss of definition of the portal triads. No focal liver lesion. Portal vein is patent on color Doppler imaging with normal direction of blood flow towards the liver. Other: None. IMPRESSION: 1. Cholelithiasis without sonographic evidence of acute  cholecystitis. 2. Likely mild hepatic steatosis. Electronically Signed   By: Lovena Le M.D.   On: 11/19/2020 23:59      Flora Lipps, MD  [facility] 11/21/2020  If 7PM-7AM, please contact night-coverage

## 2020-11-21 NOTE — Plan of Care (Signed)

## 2020-11-21 NOTE — Transfer of Care (Signed)
Immediate Anesthesia Transfer of Care Note  Patient: Julie Anderson  Procedure(s) Performed: LAPAROSCOPIC CHOLECYSTECTOMY WITH INTRAOPERATIVE CHOLANGIOGRAM (N/A )  Patient Location: PACU  Anesthesia Type:General  Level of Consciousness: drowsy  Airway & Oxygen Therapy: Patient Spontanous Breathing and Patient connected to face mask oxygen  Post-op Assessment: Report given to RN and Post -op Vital signs reviewed and stable  Post vital signs: Reviewed and stable  Last Vitals:  Vitals Value Taken Time  BP 129/48 11/21/20 1226  Temp    Pulse 117 11/21/20 1227  Resp 18 11/21/20 1227  SpO2 100 % 11/21/20 1227  Vitals shown include unvalidated device data.  Last Pain:  Vitals:   11/21/20 1054  TempSrc:   PainSc: 0-No pain      Patients Stated Pain Goal: 2 (37/44/51 4604)  Complications: No complications documented.

## 2020-11-22 ENCOUNTER — Encounter (HOSPITAL_COMMUNITY): Payer: Self-pay | Admitting: Surgery

## 2020-11-22 LAB — MAGNESIUM: Magnesium: 2 mg/dL (ref 1.7–2.4)

## 2020-11-22 LAB — COMPREHENSIVE METABOLIC PANEL
ALT: 120 U/L — ABNORMAL HIGH (ref 0–44)
AST: 74 U/L — ABNORMAL HIGH (ref 15–41)
Albumin: 3.9 g/dL (ref 3.5–5.0)
Alkaline Phosphatase: 96 U/L (ref 38–126)
Anion gap: 10 (ref 5–15)
BUN: 8 mg/dL (ref 6–20)
CO2: 22 mmol/L (ref 22–32)
Calcium: 9.3 mg/dL (ref 8.9–10.3)
Chloride: 105 mmol/L (ref 98–111)
Creatinine, Ser: 0.77 mg/dL (ref 0.44–1.00)
GFR, Estimated: >60 mL/min (ref >60)
Glucose, Bld: 146 mg/dL — ABNORMAL HIGH (ref 70–99)
Potassium: 3.9 mmol/L (ref 3.5–5.1)
Sodium: 137 mmol/L (ref 135–145)
Total Bilirubin: 0.6 mg/dL (ref 0.3–1.2)
Total Protein: 7.2 g/dL (ref 6.5–8.1)

## 2020-11-22 LAB — CBC
HCT: 36.7 % (ref 36.0–46.0)
Hemoglobin: 12.3 g/dL (ref 12.0–15.0)
MCH: 30.8 pg (ref 26.0–34.0)
MCHC: 33.5 g/dL (ref 30.0–36.0)
MCV: 92 fL (ref 80.0–100.0)
Platelets: 317 10*3/uL (ref 150–400)
RBC: 3.99 MIL/uL (ref 3.87–5.11)
RDW: 11.8 % (ref 11.5–15.5)
WBC: 7.9 10*3/uL (ref 4.0–10.5)
nRBC: 0 % (ref 0.0–0.2)

## 2020-11-22 LAB — PHOSPHORUS: Phosphorus: 4.9 mg/dL — ABNORMAL HIGH (ref 2.5–4.6)

## 2020-11-22 MED ORDER — ACETAMINOPHEN 500 MG PO TABS: 500.0000 mg | ORAL_TABLET | Freq: Four times a day (QID) | ORAL | 2 refills | Status: AC | PRN

## 2020-11-22 MED ORDER — IBUPROFEN 600 MG PO TABS: 600.0000 mg | ORAL_TABLET | Freq: Four times a day (QID) | ORAL | 1 refills | Status: DC | PRN

## 2020-11-22 MED ORDER — PANTOPRAZOLE SODIUM 40 MG PO TBEC: 40.0000 mg | DELAYED_RELEASE_TABLET | Freq: Every day | ORAL | 0 refills | Status: DC

## 2020-11-22 NOTE — Plan of Care (Signed)
Patient discharged all careplans met  

## 2020-11-22 NOTE — Discharge Summary (Signed)
Physician Discharge Summary  Julie Anderson:[id number] DOB: Jan 16, 1996 DOA: 11/19/2020  PCP: Binnie Rail, MD  Admit date: 11/19/2020 Discharge date: 11/22/2020  Admitted From: Home  Discharge disposition: Home   Recommendations for Outpatient Follow-Up:   . Follow up with your primary care provider in one week.  . Check CBC, BMP, magnesium in the next visit  Discharge Diagnosis:   Principal Problem:   Pancreatitis Active Problems:   Acute cholecystitis  Discharge Condition: Improved.  Diet recommendation: Low-fat diet  Wound care: None.  Code status: Full.   History of Present Illness:   Patient is 25 years old female with history of ADD, anxiety, depression and gallstone presented to hospital with epigastric and right upper quadrant pain.  Patient had seen her family doctor and an ultrasound had shown gallstones without acute cholecystitis.  Plan was to follow-up with general surgery as outpatient but due to ongoing pain, patient presented to hospital.  In the ED, patient had cholelithiasis.  General surgery was consulted.  Initial lipase level over 1200.  During hospitalization, general surgery saw the patient.  Patient was then considered for laparoscopic cholecystectomy on 11/21/2020.   Hospital Course:   Following conditions were addressed during hospitalization as listed below,  Acute gallstone pancreatitis.  Patient is post laparoscopic cholecystectomy 11/21/2020, mildly elevated LFTs on presentation.  Right upper quadrant ultrasound showed gallstones without cholecystitis. Lipase of 1212 on presentation.  Elevated total cholesterol, triglyceride of 251.  Hemoglobin A1c of 5.5.  Patient has been initiated on oral diet.  He has tolerated oral diet well.  At this time patient is stable for disposition home.  Hyperlipidemia.   Spoke with the patient about it. Will need lifestyle modification and dietary changes.  History of hyperhomocystinemia as per the  family at bedside.  Disposition.  At this time, patient is stable for disposition home with outpatient surgical follow-up.  Medical Consultants:    General surgery  Procedures:     Laparoscopic cholecystectomy on 11/21/2020  Subjective:   Today, patient was seen and examined at bedside.  Patient denies any nausea vomiting shortness of breath chills or rigor has mild abdominal pain.  Discharge Exam:   Vitals:   11/22/20 0509 11/22/20 0933  BP: (!) 113/58 107/64  Pulse: 63 61  Resp: 17 20  Temp: 98.7 F (37.1 C) 99.3 F (37.4 C)  SpO2: 98% 98%   Vitals:   11/21/20 2138 11/22/20 0143 11/22/20 0509 11/22/20 0933  BP: 119/88 120/90 (!) 113/58 107/64  Pulse: 71 70 63 61  Resp: 17 17 17 20   Temp: 98.1 F (36.7 C) 98 F (36.7 C) 98.7 F (37.1 C) 99.3 F (37.4 C)  TempSrc: Oral Axillary Oral Oral  SpO2: 98% 98% 98% 98%  Weight:      Height:        General: Alert awake, not in obvious distress, obese HENT: pupils equally reacting to light,  No scleral pallor or icterus noted. Oral mucosa is moist.  Chest:  Clear breath sounds.  Diminished breath sounds bilaterally. No crackles or wheezes.  CVS: S1 &S2 heard. No murmur.  Regular rate and rhythm. Abdomen: Soft, nontender, nondistended.  Bowel sounds are heard.  Status post laparoscopic cholecystectomy. Extremities: No cyanosis, clubbing or edema.  Peripheral pulses are palpable. Psych: Alert, awake and oriented, normal mood CNS:  No cranial nerve deficits.  Power equal in all extremities.   Skin: Warm and dry.  No rashes noted.  The results of significant diagnostics from  this hospitalization (including imaging, microbiology, ancillary and laboratory) are listed below for reference.     Diagnostic Studies:   US Abdomen Limited RUQ (LIVER/GB)  Result Date: 11/19/2020 CLINICAL DATA:  Right upper quadrant pain EXAM: ULTRASOUND ABDOMEN LIMITED RIGHT UPPER QUADRANT COMPARISON:  Ultrasound 11/13/2020 FINDINGS:  Gallbladder: Redemonstration of the tiny layering, shadowing gallstones within the gallbladder lumen, likely a mixture of biliary stones/biliary sand. No abnormal gallbladder wall thickening. No pericholecystic fluid. Sonographic Percell Miller sign is reportedly negative. Common bile duct: Diameter: 4 mm, nondilated Liver: Mildly increased hepatic echogenicity with loss of definition of the portal triads. No focal liver lesion. Portal vein is patent on color Doppler imaging with normal direction of blood flow towards the liver. Other: None. IMPRESSION: 1. Cholelithiasis without sonographic evidence of acute cholecystitis. 2. Likely mild hepatic steatosis. Electronically Signed   By: Lovena Le M.D.   On: 11/19/2020 23:59     Labs:   Basic Metabolic Panel: Recent Labs  Lab 11/19/20 2217 11/20/20 0457 11/21/20 0236 11/22/20 0252  NA 142 138 141 137  K 4.0 3.8 3.9 3.9  CL 110 108 112* 105  CO2 21* 20* 22 22  GLUCOSE 123* 99 95 146*  BUN 16 15 7 8   CREATININE 0.85 0.71 0.70 0.77  CALCIUM 9.6 8.7* 8.9 9.3  MG  --   --  2.1 2.0  PHOS  --   --  4.3 4.9*   GFR Estimated Creatinine Clearance (by C-G formula based on SCr of 0.77 mg/dL) Female: 89.8 mL/min Female: 111.1 mL/min Liver Function Tests: Recent Labs  Lab 11/19/20 2217 11/20/20 0457 11/21/20 0236 11/22/20 0252  AST 175* 189* 57* 74*  ALT 108* 158* 96* 120*  ALKPHOS 115 110 88 96  BILITOT 1.2 0.7 0.8 0.6  PROT 8.0 7.1 6.6 7.2  ALBUMIN 4.1 3.8 3.6 3.9   Recent Labs  Lab 11/19/20 2217 11/20/20 0457  LIPASE 1,212* 174*   No results for input(s): AMMONIA in the last 168 hours. Coagulation profile No results for input(s): INR, PROTIME in the last 168 hours.  CBC: Recent Labs  Lab 11/19/20 2217 11/20/20 0457 11/21/20 0236 11/22/20 0252  WBC 9.1 5.5 4.4 7.9  HGB 13.4 12.3 12.1 12.3  HCT 40.3 37.1 37.2 36.7  MCV 92.4 93.5 94.7 92.0  PLT 331 272 274 317   Cardiac Enzymes: No results for input(s): CKTOTAL, CKMB,  CKMBINDEX, TROPONINI in the last 168 hours. BNP: Invalid input(s): POCBNP CBG: No results for input(s): GLUCAP in the last 168 hours. D-Dimer No results for input(s): DDIMER in the last 72 hours. Hgb A1c No results for input(s): HGBA1C in the last 72 hours. Lipid Profile No results for input(s): CHOL, HDL, LDLCALC, TRIG, CHOLHDL, LDLDIRECT in the last 72 hours. Thyroid function studies No results for input(s): TSH, T4TOTAL, T3FREE, THYROIDAB in the last 72 hours.  Invalid input(s): FREET3 Anemia work up No results for input(s): VITAMINB12, FOLATE, FERRITIN, TIBC, IRON, RETICCTPCT in the last 72 hours. Microbiology Recent Results (from the past 240 hour(s))  Resp Panel by RT-PCR (Flu A&B, Covid) Nasopharyngeal Swab     Status: None   Collection Time: 11/20/20  1:27 AM   Specimen: Nasopharyngeal Swab; Nasopharyngeal(NP) swabs in vial transport medium  Result Value Ref Range Status   SARS Coronavirus 2 by RT PCR NEGATIVE NEGATIVE Final    Comment: (NOTE) SARS-CoV-2 target nucleic acids are NOT DETECTED.  The SARS-CoV-2 RNA is generally detectable in upper respiratory specimens during the acute phase of infection. The  lowest concentration of SARS-CoV-2 viral copies this assay can detect is 138 copies/mL. A negative result does not preclude SARS-Cov-2 infection and should not be used as the sole basis for treatment or other patient management decisions. A negative result may occur with  improper specimen collection/handling, submission of specimen other than nasopharyngeal swab, presence of viral mutation(s) within the areas targeted by this assay, and inadequate number of viral copies(<138 copies/mL). A negative result must be combined with clinical observations, patient history, and epidemiological information. The expected result is Negative.  Fact Sheet for Patients:  EntrepreneurPulse.com.au  Fact Sheet for Healthcare Providers:   IncredibleEmployment.be  This test is no t yet approved or cleared by the [place] FDA and  has been authorized for detection and/or diagnosis of SARS-CoV-2 by FDA under an Emergency Use Authorization (EUA). This EUA will remain  in effect (meaning this test can be used) for the duration of the COVID-19 declaration under Section 564(b)(1) of the Act, 21 U.S.C.section 360bbb-3(b)(1), unless the authorization is terminated  or revoked sooner.       Influenza A by PCR NEGATIVE NEGATIVE Final   Influenza B by PCR NEGATIVE NEGATIVE Final    Comment: (NOTE) The Xpert Xpress SARS-CoV-2/FLU/RSV plus assay is intended as an aid in the diagnosis of influenza from Nasopharyngeal swab specimens and should not be used as a sole basis for treatment. Nasal washings and aspirates are unacceptable for Xpert Xpress SARS-CoV-2/FLU/RSV testing.  Fact Sheet for Patients: EntrepreneurPulse.com.au  Fact Sheet for Healthcare Providers: IncredibleEmployment.be  This test is not yet approved or cleared by the [place] FDA and has been authorized for detection and/or diagnosis of SARS-CoV-2 by FDA under an Emergency Use Authorization (EUA). This EUA will remain in effect (meaning this test can be used) for the duration of the COVID-19 declaration under Section 564(b)(1) of the Act, 21 U.S.C. section 360bbb-3(b)(1), unless the authorization is terminated or revoked.  Performed at [facility], [place] [address], [place], [place] [address]   Surgical PCR screen     Status: Abnormal   Collection Time: 11/20/20  8:40 PM   Specimen: Nasal Mucosa; Nasal Swab  Result Value Ref Range Status   MRSA, PCR NEGATIVE NEGATIVE Final   Staphylococcus aureus POSITIVE (A) NEGATIVE Final    Comment: (NOTE) The Xpert SA Assay (FDA approved for NASAL specimens in patients 31 years of age and older), is one component of a  comprehensive surveillance program. It is not intended to diagnose infection nor to guide or monitor treatment. Performed at [facility], [place] [address], [place], [place] [address]      Discharge Instructions:   Discharge Instructions     Call MD for:  persistant nausea and vomiting   Complete by: As directed    Call MD for:  redness, tenderness, or signs of infection (pain, swelling, redness, odor or green/yellow discharge around incision site)   Complete by: As directed    Call MD for:  severe uncontrolled pain   Complete by: As directed    Call MD for:  temperature >100.4   Complete by: As directed    Diet - low sodium heart healthy   Complete by: As directed    Discharge instructions   Complete by: As directed    Follow-up with general surgery as has been scheduled. Take medications as prescribed. No overexertion   Increase activity slowly   Complete by: As directed    No wound care   Complete by:  As directed       Allergies as of 11/22/2020       Reactions   Cymbalta [duloxetine Hcl]    Nausea, disorientation        Medication List     TAKE these medications    acetaminophen 500 MG tablet Commonly known as: TYLENOL Take 1 tablet (500 mg total) by mouth every 6 (six) hours as needed for up to 10 days for mild pain or moderate pain.   amphetamine-dextroamphetamine 20 MG tablet Commonly known as: Adderall Take 1 tablet (20 mg total) by mouth daily. What changed:   when to take this  reasons to take this   Folbee 2.5-25-1 MG Tabs tablet Generic drug: Folic Acid-Vit B4-WHQ P59 Take 1 tablet by mouth daily. Follow-up appt is due in must see provider for future refills   ibuprofen 600 MG tablet Commonly known as: ADVIL Take 1 tablet (600 mg total) by mouth every 6 (six) hours as needed for mild pain or moderate pain.   Nexplanon 68 MG Impl implant Generic drug: etonogestrel 1 each by Subdermal route once. 2016 placed    pantoprazole 40 MG tablet Commonly known as: Protonix Take 1 tablet (40 mg total) by mouth daily for 14 days.        Follow-up Information     Sur[facility], [place] Follow up on 12/11/2020.   Specialty: General Surgery Why: 2:30 pm, arrive by 2:00pm for paperwork and check in process Contact information: [place] [address] [phone]                  Time coordinating discharge: 39 minutes  Signed:  Laxman Pokhrel  [facility] 11/22/2020, 9:51 AM

## 2020-11-24 ENCOUNTER — Other Ambulatory Visit: Payer: Self-pay | Admitting: *Deleted

## 2020-11-24 LAB — SURGICAL PATHOLOGY

## 2020-11-24 NOTE — Patient Outreach (Signed)
[facility]) Care Management  11/24/2020  Julie Anderson [birth year] [id number]   Transition of care telephone call  Referral received:11/21/20 Initial outreach:11/24/20 Insurance: [facility]   Initial unsuccessful telephone call to patient's preferred number in order to complete transition of care assessment; no answer, left HIPAA compliant voicemail message requesting return call.   Objective: Per electronic record Julie Anderson " Julie Anderson"  was hospitalized at [facility] 3/24-3/26/22 for Acute Gallstone Pancreatitis, 3/25 Laparoscopic cholecystectomy  Comorbidities include: Anxiety , Depression.  She  was discharged to home on 11/22/20  without the need for home health services or DME.  Plan: This RNCM will route unsuccessful outreach letter with [facility] [employer] pamphlet and 24 hour Nurse Advice Line Magnet to [facility] clinical pool to be mailed to patient's home address. This RNCM will attempt another outreach within 4 business days.   Julie Draft, RN, BSN  [facility] Management Coordinator  [phone]- Mobile [phone]- Toll Free Main Office

## 2020-11-27 ENCOUNTER — Encounter: Payer: Self-pay | Admitting: *Deleted

## 2020-11-27 ENCOUNTER — Other Ambulatory Visit: Payer: Self-pay | Admitting: *Deleted

## 2020-11-27 NOTE — Patient Outreach (Addendum)
[facility]) Care Management  11/27/2020  Julie Anderson 02-Oct-1995 [id number]   Transition of care call/case closure   Referral received:11/21/20 Initial outreach:11/24/20 Insurance: [place] [employer]    #2 Outreach call attempt  Subjective:  Successful telephone call to patient's preferred number in order to complete transition of care assessment; 2 HIPAA identifiers verified. Explained purpose of call and completed transition of care assessment.  Julie "Max" states that she is doing alright.  denies post-operative problems, says surgical incisions are unremarkable, states surgical pain well managed with prescribed medications. She reports  tolerating diet without complaints of nausea. She reports that she has had bowel movement since being at home. She reports urine clear, she reports at times having  some discomfort when urinating states she first noted while in hospital, she reports nurse discussing that she could have had a  catheter in place She denies fever, change in urine color, urinating usual amounts, reviewed s&s of infection reinforced notifying MD of continued concerns, staying hydrated, she verbalized understanding.   She report tolerating mobility in the home. Patient boyfriend, sister and parents assisting in her recovery.  Reviewed accessing the following [place] Benefits : She says she has used [facility] outpatient pharmacy at [facility] but usually [facility].   Objective:  Julie Gholson " Max" was hospitalized [facility] 3/24-3/26/22 for Acute Gallstone Pancreatitis, 3/25 Laparoscopic cholecystectomy  Comorbidities include: Anxiety , Depression.  She  was discharged to home on 11/22/20  without the need for home health servicesor DME.Marland Kitchen   Assessment:  Patient voices good understanding of all discharge instructions.  See transition of care flowsheet for assessment details.   Plan:  Reviewed hospital discharge diagnosis of Acute  Pancreatitis,Laparoscopic cholecystectomy    and discharge treatment plan using hospital discharge instructions, assessing medication adherence, reviewing problems requiring provider notification, and discussing the importance of follow up with surgeon, primary care provider and/or specialists as directed. Reinforced follow up with PCP in one week regarding labs recheck as noted on discharge summary reviewed with patient. As well as scheduled post op visit with surgeon she voiced understanding .    No ongoing care management needs identified so will close case to [place] Management services. Patient was routed [facility] outreach letter at initial call.    Joylene Draft, RN, BSN  [place] Management Coordinator  [phone]- Mobile [phone]- Toll Free Main Office

## 2020-12-02 NOTE — Progress Notes (Signed)
Subjective:    Patient ID: Julie Anderson, adult    DOB: 10-Jun-1996, 25 y.o.   MRN: [id number]  HPI The patient is here for follow up from her recent hospitalization.  She was admitted 3/23-3/26 for acute biliary pancreatitis, cholecystitis.  She had a laparoscopic cholecystectomy with intraoperative cholangiogram on 11/21/2020.      Medications and allergies reviewed with patient and updated if appropriate.  Patient Active Problem List   Diagnosis Date Noted  . Pancreatitis 11/20/2020  . Acute cholecystitis 11/20/2020  . Cholelithiasis 11/13/2020  . Colicky epigastric pain 10/01/2020  . Migraine without status migrainosus, not intractable 07/19/2019  . Right sided weakness 07/19/2019  . Seborrheic dermatitis, unspecified 05/17/2018  . Memory difficulties 11/28/2017  . Gender identity disorder 11/28/2017  . Cough 09/14/2016  . Hyperlipidemia 07/05/2016  . Vitamin D deficiency 07/05/2016  . Hyperglycemia 07/05/2016  . Hyperhomocysteinemia ([place]) 07/05/2016  . Hyperacusis 07/05/2016  . Lumbar degenerative disc disease 06/25/2015  . Myofascial pain syndrome 06/06/2015  . ADD (attention deficit disorder) 09/24/2013  . Anxiety and depression 07/12/2013  . Auditory hallucination 07/12/2013  . Allergic rhinitis 07/12/2013    Current Outpatient Medications on File Prior to Visit  Medication Sig Dispense Refill  . acetaminophen (TYLENOL) 500 MG tablet Take 1 tablet (500 mg total) by mouth every 6 (six) hours as needed for up to 10 days for mild pain or moderate pain. 40 tablet 2  . amphetamine-dextroamphetamine (ADDERALL) 20 MG tablet Take 1 tablet (20 mg total) by mouth daily. (Patient taking differently: Take 20 mg by mouth daily as needed.) 30 tablet 0  . etonogestrel (NEXPLANON) 68 MG IMPL implant 1 each by Subdermal route once. 2016 placed    . Folic Acid-Vit X3-KGM W10 (FOLBEE) 2.5-25-1 MG TABS tablet Take 1 tablet by mouth daily. Follow-up appt is due in must see provider for  future refills 90 tablet 0  . ibuprofen (ADVIL) 600 MG tablet Take 1 tablet (600 mg total) by mouth every 6 (six) hours as needed for mild pain or moderate pain. 30 tablet 1  . pantoprazole (PROTONIX) 40 MG tablet Take 1 tablet (40 mg total) by mouth daily for 14 days. 14 tablet 0   No current facility-administered medications on file prior to visit.    Past Medical History:  Diagnosis Date  . ADD (attention deficit disorder)   . Anxiety   . Depression   . Hyperhomocysteinemia ([place])     Past Surgical History:  Procedure Laterality Date  . CHOLECYSTECTOMY N/A 11/21/2020   Procedure: LAPAROSCOPIC CHOLECYSTECTOMY WITH INTRAOPERATIVE CHOLANGIOGRAM;  Surgeon: Dwan Bolt, MD;  Location: [facility];  Service: General;  Laterality: N/A;  . TYMPANOSTOMY TUBE PLACEMENT      Social History   Socioeconomic History  . Marital status: Significant Other    Spouse name: Not on file  . Number of children: Not on file  . Years of education: Not on file  . Highest education level: Not on file  Occupational History  . Not on file  Tobacco Use  . Smoking status: Never Smoker  . Smokeless tobacco: Never Used  Vaping Use  . Vaping Use: Never used  Substance and Sexual Activity  . Alcohol use: Yes    Comment: socially  . Drug use: Yes    Types: Marijuana  . Sexual activity: Not on file  Other Topics Concern  . 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 BP Readings from Last 3 Encounters:  11/22/20 107/64  10/01/20 110/72  09/26/20 121/77   Wt Readings from Last 3 Encounters:  11/19/20 140 lb (63.5 kg)   10/01/20 143 lb 9.6 oz (65.1 kg)  09/26/20 138 lb (62.6 kg)   There is no height or weight on file to calculate BMI.   Physical Exam    Constitutional: Appears well-developed and well-nourished. No distress.  HENT:  Head: Normocephalic and atraumatic.  Neck: Neck supple. No tracheal deviation present. No thyromegaly present.  No cervical lymphadenopathy Cardiovascular: Normal rate, regular rhythm and normal heart sounds.   No murmur heard. No carotid bruit .  No edema Pulmonary/Chest: Effort normal and breath sounds normal. No respiratory distress. No has no wheezes. No rales.  Abdomen: Soft, nontender, nondistended Skin: Skin is warm and dry. Not diaphoretic.  Psychiatric: Normal mood and affect. Behavior is normal.   Lab Results  Component Value Date   WBC 7.9 11/22/2020   HGB 12.3 11/22/2020   HCT 36.7 11/22/2020   PLT 317 11/22/2020   GLUCOSE 146 (H) 11/22/2020   CHOL 312 (H) 10/01/2020   TRIG 251.0 (H) 10/01/2020   HDL 56.30 10/01/2020   LDLDIRECT 214.0 10/01/2020   LDLCALC 217 (H) 11/28/2017   ALT 120 (H) 11/22/2020   AST 74 (H) 11/22/2020   NA 137 11/22/2020   K 3.9 11/22/2020   CL 105 11/22/2020   CREATININE 0.77 11/22/2020   BUN 8 11/22/2020   CO2 22 11/22/2020   TSH 1.36 10/01/2020   INR 1.0 07/22/2019   HGBA1C 5.5 10/01/2020    DG Cholangiogram Operative CLINICAL DATA:  Cholelithiasis  EXAM: INTRAOPERATIVE CHOLANGIOGRAM  TECHNIQUE: Cholangiographic images from the C-arm fluoroscopic device were submitted for interpretation post-operatively. Please see the procedural report for the amount of contrast and the fluoroscopy time utilized.  COMPARISON:  Ultrasound 11/19/2020  FINDINGS: No persistent filling defects in the common duct. Intrahepatic ducts are incompletely visualized, appearing decompressed centrally. Contrast passes into the duodenum.  : Negative for retained common duct stone.  Or  Electronically Signed   By: Lucrezia Europe M.D.    On: 11/21/2020 12:17     Assessment & Plan:    See Problem List for Assessment and Plan of chronic medical problems.    This visit occurred during the SARS-CoV-2 public health emergency.  Safety protocols were in place, including screening questions prior to the visit, additional usage of staff PPE, and extensive cleaning of exam room while observing appropriate contact time as indicated for disinfecting solutions.    This encounter was created in error - please disregard.

## 2020-12-02 NOTE — Assessment & Plan Note (Signed)
S/p lab chole

## 2020-12-02 NOTE — Assessment & Plan Note (Signed)
Secondary to gallstones S/p lap chole w/ intraoperative cholangiogram on 3/25 Check cbc, cmp, mag

## 2020-12-02 NOTE — Patient Instructions (Addendum)
  Blood work was ordered.     Medications changes include :   none  Your prescription(s) have been submitted to your pharmacy. Please take as directed and contact our office if you believe you are having problem(s) with the medication(s).   A referral was ordered for        Someone from their office will call you to schedule an appointment.

## 2020-12-03 ENCOUNTER — Encounter: Payer: 59 | Admitting: Internal Medicine

## 2020-12-03 DIAGNOSIS — Z0289 Encounter for other administrative examinations: Secondary | ICD-10-CM

## 2020-12-03 DIAGNOSIS — K851 Biliary acute pancreatitis without necrosis or infection: Secondary | ICD-10-CM

## 2020-12-03 DIAGNOSIS — K810 Acute cholecystitis: Secondary | ICD-10-CM

## 2020-12-04 ENCOUNTER — Encounter: Payer: Self-pay | Admitting: Internal Medicine

## 2020-12-04 NOTE — Progress Notes (Signed)
Subjective:    Patient ID: Julie Anderson, adult    DOB: 1996/02/07, 25 y.o.   MRN: [id number]  HPI The patient is here for follow up from her recent hospitalization.  She was admitted 3/23-3/26 for acute biliary pancreatitis, cholecystitis.  She had a laparoscopic cholecystectomy with intraoperative cholangiogram on 11/21/2020.  Since the hospital she feels much better.  The first week she was very weak and needed help to do her ADL's.  she had diarrhea the first week, but that is better.  She feels her appetite and she gets full quickly.  She drinks a good amount.   She has discomfort near her belly button.  It is better.  She can get up on her own and can go up stairs.  She tires easily.   Medications and allergies reviewed with patient and updated if appropriate.  Patient Active Problem List   Diagnosis Date Noted  . Pancreatitis 11/20/2020  . Acute cholecystitis 11/20/2020  . Colicky epigastric pain 10/01/2020  . Migraine without status migrainosus, not intractable 07/19/2019  . Right sided weakness 07/19/2019  . Seborrheic dermatitis, unspecified 05/17/2018  . Memory difficulties 11/28/2017  . Gender identity disorder 11/28/2017  . Cough 09/14/2016  . Hyperlipidemia 07/05/2016  . Vitamin D deficiency 07/05/2016  . Hyperglycemia 07/05/2016  . Hyperhomocysteinemia ([place]) 07/05/2016  . Hyperacusis 07/05/2016  . Lumbar degenerative disc disease 06/25/2015  . Myofascial pain syndrome 06/06/2015  . ADD (attention deficit disorder) 09/24/2013  . Anxiety and depression 07/12/2013  . Auditory hallucination 07/12/2013  . Allergic rhinitis 07/12/2013    Current Outpatient Medications on File Prior to Visit  Medication Sig Dispense Refill  . amphetamine-dextroamphetamine (ADDERALL) 20 MG tablet Take 1 tablet (20 mg total) by mouth daily. (Patient taking differently: Take 20 mg by mouth daily as needed.) 30 tablet 0  . etonogestrel (NEXPLANON) 68 MG IMPL implant 1 each by Subdermal  route once. 2016 placed    . Folic Acid-Vit H8-NID P82 (FOLBEE) 2.5-25-1 MG TABS tablet Take 1 tablet by mouth daily. Follow-up appt is due in must see provider for future refills 90 tablet 0   No current facility-administered medications on file prior to visit.    Past Medical History:  Diagnosis Date  . ADD (attention deficit disorder)   . Anxiety   . Depression   . Hyperhomocysteinemia ([place])     Past Surgical History:  Procedure Laterality Date  . CHOLECYSTECTOMY N/A 11/21/2020   Procedure: LAPAROSCOPIC CHOLECYSTECTOMY WITH INTRAOPERATIVE CHOLANGIOGRAM;  Surgeon: Dwan Bolt, MD;  Location: [facility];  Service: General;  Laterality: N/A;  . TYMPANOSTOMY TUBE PLACEMENT      Social History   Socioeconomic History  . Marital status: Significant Other    Spouse name: Not on file  . Number of children: Not on file  . Years of education: Not on file  . Highest education level: Not on file  Occupational History  . Not on file  Tobacco Use  . Smoking status: Never Smoker  . Smokeless tobacco: Never Used  Vaping Use  . Vaping Use: Never used  Substance and Sexual Activity  . Alcohol use: Yes    Comment: socially  . Drug use: Yes    Types: Marijuana  . Sexual activity: Not on file  Other Topics Concern  . 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  Respiratory: Positive for shortness of breath (occ). Negative for cough and wheezing.   Cardiovascular: Negative for chest pain, palpitations and leg swelling.  Gastrointestinal: Positive for abdominal pain (near incision). Negative  for blood in stool, constipation, diarrhea and nausea.  Genitourinary: Negative for dysuria and frequency.  Neurological: Negative for light-headedness and headaches.       Objective:   Vitals:   12/05/20 1310  BP: 108/80  Pulse: 100  Temp: 98.7 F (37.1 C)  SpO2: 97%   BP Readings from Last 3 Encounters:  12/05/20 108/80  11/22/20 107/64  10/01/20 110/72   Wt Readings from Last 3 Encounters:  12/05/20 145 lb (65.8 kg)  11/19/20 140 lb (63.5 kg)  10/01/20 143 lb 9.6 oz (65.1 kg)   Body mass index is 28.8 kg/m.   Physical Exam    Constitutional: Appears well-developed and well-nourished. No distress.  HENT:  Head: Normocephalic and atraumatic.  Cardiovascular: Normal rate, regular rhythm and normal heart sounds.   No murmur heard. No carotid bruit .  No edema Pulmonary/Chest: Effort normal and breath sounds normal. No respiratory distress. No has no wheezes. No rales.  Abdomen: Soft, nondistended, mild diffuse tenderness, increased tenderness near umbilicus without rebound or guarding.  All incision sites healing well without surrounding erythema Skin: Skin is warm and dry. Not diaphoretic.  Psychiatric: Normal mood and affect. Behavior is normal.   Lab Results  Component Value Date   WBC 7.9 11/22/2020   HGB 12.3 11/22/2020   HCT 36.7 11/22/2020   PLT 317 11/22/2020   GLUCOSE 146 (H) 11/22/2020   CHOL 312 (H) 10/01/2020   TRIG 251.0 (H) 10/01/2020   HDL 56.30 10/01/2020   LDLDIRECT 214.0 10/01/2020   LDLCALC 217 (H) 11/28/2017   ALT 120 (H) 11/22/2020   AST 74 (H) 11/22/2020   NA 137 11/22/2020   K 3.9 11/22/2020   CL 105 11/22/2020   CREATININE 0.77 11/22/2020   BUN 8 11/22/2020   CO2 22 11/22/2020   TSH 1.36 10/01/2020   INR 1.0 07/19/2019   HGBA1C 5.5 10/01/2020    DG Cholangiogram Operative CLINICAL DATA:  Cholelithiasis  EXAM: INTRAOPERATIVE CHOLANGIOGRAM  TECHNIQUE: Cholangiographic images from the C-arm fluoroscopic device  were submitted for interpretation post-operatively. Please see the procedural report for the amount of contrast and the fluoroscopy time utilized.  COMPARISON:  Ultrasound 11/19/2020  FINDINGS: No persistent filling defects in the common duct. Intrahepatic ducts are incompletely visualized, appearing decompressed centrally. Contrast passes into the duodenum.  : Negative for retained common duct stone.  Or  Electronically Signed   By: Lucrezia Europe M.D.   On: 11/21/2020 12:17     Assessment & Plan:    See Problem List for Assessment and Plan of chronic medical problems.    This visit occurred during the SARS-CoV-2 public health emergency.  Safety protocols were in place, including screening questions prior to the visit, additional usage of staff PPE, and extensive cleaning of exam room while observing appropriate contact time as indicated for disinfecting solutions.

## 2020-12-05 ENCOUNTER — Ambulatory Visit: Payer: 59 | Admitting: Internal Medicine

## 2020-12-05 ENCOUNTER — Encounter: Payer: Self-pay | Admitting: Internal Medicine

## 2020-12-05 ENCOUNTER — Other Ambulatory Visit: Payer: Self-pay

## 2020-12-05 VITALS — BP 108/80 | HR 100 | Temp 98.7°F | Ht 59.5 in | Wt 145.0 lb

## 2020-12-05 DIAGNOSIS — K810 Acute cholecystitis: Secondary | ICD-10-CM | POA: Diagnosis not present

## 2020-12-05 DIAGNOSIS — Z8719 Personal history of other diseases of the digestive system: Secondary | ICD-10-CM | POA: Diagnosis not present

## 2020-12-05 DIAGNOSIS — K851 Biliary acute pancreatitis without necrosis or infection: Secondary | ICD-10-CM

## 2020-12-05 LAB — CBC WITH DIFFERENTIAL/PLATELET
Basophils Absolute: 0 10*3/uL (ref 0.0–0.1)
Basophils Relative: 0.3 % (ref 0.0–3.0)
Eosinophils Absolute: 0.2 10*3/uL (ref 0.0–0.7)
Eosinophils Relative: 3.5 % (ref 0.0–5.0)
HCT: 36.8 % (ref 36.0–46.0)
Hemoglobin: 12.6 g/dL (ref 12.0–15.0)
Lymphocytes Relative: 41.7 % (ref 12.0–46.0)
Lymphs Abs: 2.8 10*3/uL (ref 0.7–4.0)
MCHC: 34.2 g/dL (ref 30.0–36.0)
MCV: 89.9 fl (ref 78.0–100.0)
Monocytes Absolute: 0.5 10*3/uL (ref 0.1–1.0)
Monocytes Relative: 7.4 % (ref 3.0–12.0)
Neutro Abs: 3.2 10*3/uL (ref 1.4–7.7)
Neutrophils Relative %: 47.1 % (ref 43.0–77.0)
Platelets: 335 10*3/uL (ref 150.0–400.0)
RBC: 4.1 Mil/uL (ref 3.87–5.11)
RDW: 12.7 % (ref 11.5–15.5)
WBC: 6.8 10*3/uL (ref 4.0–10.5)

## 2020-12-05 LAB — COMPREHENSIVE METABOLIC PANEL
ALT: 28 U/L (ref 0–35)
AST: 19 U/L (ref 0–37)
Albumin: 4.4 g/dL (ref 3.5–5.2)
Alkaline Phosphatase: 87 U/L (ref 39–117)
BUN: 18 mg/dL (ref 6–23)
CO2: 25 mEq/L (ref 19–32)
Calcium: 9.9 mg/dL (ref 8.4–10.5)
Chloride: 102 mEq/L (ref 96–112)
Creatinine, Ser: 0.64 mg/dL (ref 0.40–1.20)
GFR: 124.24 mL/min (ref >60.00)
Glucose, Bld: 105 mg/dL — ABNORMAL HIGH (ref 70–99)
Potassium: 3.7 mEq/L (ref 3.5–5.1)
Sodium: 135 mEq/L (ref 135–145)
Total Bilirubin: 0.3 mg/dL (ref 0.2–1.2)
Total Protein: 7.6 g/dL (ref 6.0–8.3)

## 2020-12-05 LAB — MAGNESIUM: Magnesium: 1.6 mg/dL (ref 1.5–2.5)

## 2020-12-05 LAB — AMYLASE: Amylase: 48 U/L (ref 27–131)

## 2020-12-05 LAB — LIPASE: Lipase: 38 U/L (ref 11.0–59.0)

## 2020-12-05 NOTE — Assessment & Plan Note (Addendum)
Acute Associated with biliary obstruction/pancreatitis Status post lap chole 3/22 Has been helped of the hospital for almost 2 weeks and slowly is recovering Still has some discomfort in her abdomen, fatigue and decreased appetite Has follow-up with surgery next week We will check blood work to make sure her LFTs have improved-CBC, CMP, lipase, amylase and magnesium

## 2020-12-05 NOTE — Assessment & Plan Note (Signed)
Secondary to gallstones Status post lap chole with intraoperative cholangiogram on 3/25 Still having tenderness throughout abdomen, which I think is just the normal healing process, but will recheck blood work to make sure everything is progressing as it should CBC, CMP, magnesium, lipase, amylase Has follow-up with surgery next week Advised that Julie Anderson symptoms should continue to slowly improve and if she has any question she will contact me

## 2020-12-05 NOTE — Patient Instructions (Addendum)
  Blood work was ordered.     Medications changes include :   none     

## 2021-03-30 ENCOUNTER — Other Ambulatory Visit: Payer: Self-pay

## 2021-03-30 NOTE — Progress Notes (Signed)
Subjective:    Patient ID: Julie Anderson, adult    DOB: 11-06-1995, 25 y.o.   MRN: [id number]  HPI The patient is here for follow up of their chronic medical problems, including ADD  She takes the adderall prn only.  No side effects.  Medications and allergies reviewed with patient and updated if appropriate.  Patient Active Problem List   Diagnosis Date Noted   History of pancreatitis 11/20/2020   Migraine without status migrainosus, not intractable 07/19/2019   Right sided weakness 07/19/2019   Seborrheic dermatitis, unspecified 05/17/2018   Memory difficulties 11/28/2017   Gender identity disorder 11/28/2017   Cough 09/14/2016   Hyperlipidemia 07/05/2016   Vitamin D deficiency 07/05/2016   Hyperglycemia 07/05/2016   Hyperhomocysteinemia ([place]) 07/05/2016   Hyperacusis 07/05/2016   Lumbar degenerative disc disease 06/25/2015   Myofascial pain syndrome 06/06/2015   ADD (attention deficit disorder) 09/24/2013   Anxiety and depression 07/12/2013   Auditory hallucination 07/12/2013   Allergic rhinitis 07/12/2013    Current Outpatient Medications on File Prior to Visit  Medication Sig Dispense Refill   amphetamine-dextroamphetamine (ADDERALL) 20 MG tablet Take 1 tablet (20 mg total) by mouth daily. (Patient taking differently: Take 20 mg by mouth daily as needed.) 30 tablet 0   etonogestrel (NEXPLANON) 68 MG IMPL implant 1 each by Subdermal route once. Q000111Q placed     Folic Acid-Vit Q000111Q 123456 (FOLBEE) 2.5-25-1 MG TABS tablet Take 1 tablet by mouth daily. Follow-up appt is due in must see provider for future refills 90 tablet 0   No current facility-administered medications on file prior to visit.    Past Medical History:  Diagnosis Date   ADD (attention deficit disorder)    Anxiety    Depression    Hyperhomocysteinemia ([facility])     Past Surgical History:  Procedure Laterality Date   CHOLECYSTECTOMY N/A 11/21/2020   Procedure: LAPAROSCOPIC CHOLECYSTECTOMY WITH  INTRAOPERATIVE CHOLANGIOGRAM;  Surgeon: Dwan Bolt, MD;  Location: [facility];  Service: General;  Laterality: N/A;   TYMPANOSTOMY TUBE PLACEMENT      Social History   Socioeconomic History   Marital status: Significant Other    Spouse name: Not on file   Number of children: Not on file   Years of education: Not on file   Highest education level: Not on file  Occupational History   Not on file  Tobacco Use   Smoking status: Never   Smokeless tobacco: Never  Vaping Use   Vaping Use: Never used  Substance and Sexual Activity   Alcohol use: Yes    Comment: socially   Drug use: Yes    Types: Marijuana   Sexual activity: Not on file  Other Topics Concern   Not on file  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  Respiratory:  Negative for shortness of breath.   Cardiovascular:  Negative for chest pain and palpitations.  Neurological:  Negative for dizziness, light-headedness and headaches.      Objective:   Vitals:   03/31/21 1354  BP: 118/68  Pulse: (!) 105  Temp: 98 F (  36.7 C)  SpO2: 97%   BP Readings from Last 3 Encounters:  03/31/21 118/68  12/05/20 108/80  11/22/20 107/64   Wt Readings from Last 3 Encounters:  03/31/21 154 lb (69.9 kg)  12/05/20 145 lb (65.8 kg)  11/19/20 140 lb (63.5 kg)   Body mass index is 30.58 kg/m.   Physical Exam Constitutional:      General: Julie E Sarli "Max" is not in acute distress.    Appearance: Normal appearance. Julie E Legner "Max" is not ill-appearing.  HENT:     Head: Normocephalic and atraumatic.  Skin:    General: Skin is warm and dry.  Neurological:     Mental Status:  Julie Anderson "Max" is alert.  Psychiatric:        Mood and Affect: Mood normal.        Behavior: Behavior normal.           Assessment & Plan:    See Problem List for Assessment and Plan of chronic medical problems.    This visit occurred during the SARS-CoV-2 public health emergency.  Safety protocols were in place, including screening questions prior to the visit, additional usage of staff PPE, and extensive cleaning of exam room while observing appropriate contact time as indicated for disinfecting solutions.

## 2021-03-31 ENCOUNTER — Ambulatory Visit: Payer: 59 | Admitting: Internal Medicine

## 2021-03-31 ENCOUNTER — Encounter: Payer: Self-pay | Admitting: Internal Medicine

## 2021-03-31 VITALS — BP 118/68 | HR 105 | Temp 98.0°F | Ht 59.5 in | Wt 154.0 lb

## 2021-03-31 DIAGNOSIS — F988 Other specified behavioral and emotional disorders with onset usually occurring in childhood and adolescence: Secondary | ICD-10-CM

## 2021-03-31 NOTE — Patient Instructions (Addendum)
     Medications changes include :  none      Please followup in 1 year  

## 2021-03-31 NOTE — Assessment & Plan Note (Addendum)
Chronic Taking Adderall only as needed - usually when in school and not currently in school so she does not feel she needs it Works well, no side effects Continue Adderall 20 mg daily prn F/u in one year, sooner if needed

## 2021-04-23 DIAGNOSIS — Z6829 Body mass index (BMI) 29.0-29.9, adult: Secondary | ICD-10-CM | POA: Diagnosis not present

## 2021-04-23 DIAGNOSIS — Z01419 Encounter for gynecological examination (general) (routine) without abnormal findings: Secondary | ICD-10-CM | POA: Diagnosis not present

## 2021-04-23 DIAGNOSIS — Z113 Encounter for screening for infections with a predominantly sexual mode of transmission: Secondary | ICD-10-CM | POA: Diagnosis not present

## 2021-05-12 ENCOUNTER — Other Ambulatory Visit (HOSPITAL_COMMUNITY): Payer: Self-pay

## 2021-05-12 DIAGNOSIS — D2239 Melanocytic nevi of other parts of face: Secondary | ICD-10-CM | POA: Diagnosis not present

## 2021-05-12 DIAGNOSIS — D224 Melanocytic nevi of scalp and neck: Secondary | ICD-10-CM | POA: Diagnosis not present

## 2021-05-12 DIAGNOSIS — L218 Other seborrheic dermatitis: Secondary | ICD-10-CM | POA: Diagnosis not present

## 2021-05-12 DIAGNOSIS — D2272 Melanocytic nevi of left lower limb, including hip: Secondary | ICD-10-CM | POA: Diagnosis not present

## 2021-05-12 DIAGNOSIS — D225 Melanocytic nevi of trunk: Secondary | ICD-10-CM | POA: Diagnosis not present

## 2021-05-12 MED ORDER — FLUOCINONIDE 0.05 % EX SOLN
CUTANEOUS | 2 refills | Status: DC
  Filled 2021-05-12: qty 60, 30d supply, fill #0

## 2021-06-29 ENCOUNTER — Ambulatory Visit: Payer: 59

## 2021-06-29 DIAGNOSIS — Z3046 Encounter for surveillance of implantable subdermal contraceptive: Secondary | ICD-10-CM | POA: Diagnosis not present

## 2021-12-28 DIAGNOSIS — F333 Major depressive disorder, recurrent, severe with psychotic symptoms: Secondary | ICD-10-CM | POA: Diagnosis not present

## 2021-12-28 DIAGNOSIS — F411 Generalized anxiety disorder: Secondary | ICD-10-CM | POA: Diagnosis not present

## 2021-12-28 DIAGNOSIS — F4310 Post-traumatic stress disorder, unspecified: Secondary | ICD-10-CM | POA: Diagnosis not present

## 2022-01-06 DIAGNOSIS — F4310 Post-traumatic stress disorder, unspecified: Secondary | ICD-10-CM | POA: Diagnosis not present

## 2022-01-06 DIAGNOSIS — F411 Generalized anxiety disorder: Secondary | ICD-10-CM | POA: Diagnosis not present

## 2022-01-06 DIAGNOSIS — F333 Major depressive disorder, recurrent, severe with psychotic symptoms: Secondary | ICD-10-CM | POA: Diagnosis not present

## 2022-01-13 DIAGNOSIS — F411 Generalized anxiety disorder: Secondary | ICD-10-CM | POA: Diagnosis not present

## 2022-01-13 DIAGNOSIS — F4310 Post-traumatic stress disorder, unspecified: Secondary | ICD-10-CM | POA: Diagnosis not present

## 2022-01-13 DIAGNOSIS — F333 Major depressive disorder, recurrent, severe with psychotic symptoms: Secondary | ICD-10-CM | POA: Diagnosis not present

## 2022-02-03 DIAGNOSIS — F411 Generalized anxiety disorder: Secondary | ICD-10-CM | POA: Diagnosis not present

## 2022-02-03 DIAGNOSIS — F4310 Post-traumatic stress disorder, unspecified: Secondary | ICD-10-CM | POA: Diagnosis not present

## 2022-02-03 DIAGNOSIS — F333 Major depressive disorder, recurrent, severe with psychotic symptoms: Secondary | ICD-10-CM | POA: Diagnosis not present

## 2022-02-10 DIAGNOSIS — F411 Generalized anxiety disorder: Secondary | ICD-10-CM | POA: Diagnosis not present

## 2022-02-10 DIAGNOSIS — F4310 Post-traumatic stress disorder, unspecified: Secondary | ICD-10-CM | POA: Diagnosis not present

## 2022-02-10 DIAGNOSIS — F333 Major depressive disorder, recurrent, severe with psychotic symptoms: Secondary | ICD-10-CM | POA: Diagnosis not present

## 2022-02-24 DIAGNOSIS — F411 Generalized anxiety disorder: Secondary | ICD-10-CM | POA: Diagnosis not present

## 2022-02-24 DIAGNOSIS — F333 Major depressive disorder, recurrent, severe with psychotic symptoms: Secondary | ICD-10-CM | POA: Diagnosis not present

## 2022-02-24 DIAGNOSIS — F4310 Post-traumatic stress disorder, unspecified: Secondary | ICD-10-CM | POA: Diagnosis not present

## 2022-03-10 DIAGNOSIS — F4310 Post-traumatic stress disorder, unspecified: Secondary | ICD-10-CM | POA: Diagnosis not present

## 2022-03-10 DIAGNOSIS — F411 Generalized anxiety disorder: Secondary | ICD-10-CM | POA: Diagnosis not present

## 2022-03-10 DIAGNOSIS — F333 Major depressive disorder, recurrent, severe with psychotic symptoms: Secondary | ICD-10-CM | POA: Diagnosis not present

## 2022-03-17 DIAGNOSIS — F4310 Post-traumatic stress disorder, unspecified: Secondary | ICD-10-CM | POA: Diagnosis not present

## 2022-03-17 DIAGNOSIS — F411 Generalized anxiety disorder: Secondary | ICD-10-CM | POA: Diagnosis not present

## 2022-03-17 DIAGNOSIS — F333 Major depressive disorder, recurrent, severe with psychotic symptoms: Secondary | ICD-10-CM | POA: Diagnosis not present

## 2022-03-23 ENCOUNTER — Ambulatory Visit: Payer: 59 | Admitting: Internal Medicine

## 2022-03-31 DIAGNOSIS — F333 Major depressive disorder, recurrent, severe with psychotic symptoms: Secondary | ICD-10-CM | POA: Diagnosis not present

## 2022-03-31 DIAGNOSIS — F411 Generalized anxiety disorder: Secondary | ICD-10-CM | POA: Diagnosis not present

## 2022-03-31 DIAGNOSIS — F4310 Post-traumatic stress disorder, unspecified: Secondary | ICD-10-CM | POA: Diagnosis not present

## 2022-04-02 ENCOUNTER — Ambulatory Visit: Payer: 59 | Admitting: Internal Medicine

## 2022-04-02 ENCOUNTER — Encounter: Payer: Self-pay | Admitting: Internal Medicine

## 2022-04-02 VITALS — BP 120/80 | HR 89 | Temp 98.6°F | Ht 59.5 in | Wt 164.0 lb

## 2022-04-02 DIAGNOSIS — M2550 Pain in unspecified joint: Secondary | ICD-10-CM | POA: Diagnosis not present

## 2022-04-02 DIAGNOSIS — R739 Hyperglycemia, unspecified: Secondary | ICD-10-CM

## 2022-04-02 DIAGNOSIS — E7211 Homocystinuria: Secondary | ICD-10-CM

## 2022-04-02 DIAGNOSIS — E7849 Other hyperlipidemia: Secondary | ICD-10-CM

## 2022-04-02 DIAGNOSIS — E559 Vitamin D deficiency, unspecified: Secondary | ICD-10-CM

## 2022-04-02 LAB — COMPREHENSIVE METABOLIC PANEL
ALT: 68 U/L — ABNORMAL HIGH (ref 0–35)
AST: 42 U/L — ABNORMAL HIGH (ref 0–37)
Albumin: 4.8 g/dL (ref 3.5–5.2)
Alkaline Phosphatase: 77 U/L (ref 39–117)
BUN: 16 mg/dL (ref 6–23)
CO2: 25 mEq/L (ref 19–32)
Calcium: 10 mg/dL (ref 8.4–10.5)
Chloride: 105 mEq/L (ref 96–112)
Creatinine, Ser: 1.04 mg/dL (ref 0.40–1.20)
GFR: 74.91 mL/min (ref >60.00)
Glucose, Bld: 96 mg/dL (ref 70–99)
Potassium: 3.9 mEq/L (ref 3.5–5.1)
Sodium: 138 mEq/L (ref 135–145)
Total Bilirubin: 0.5 mg/dL (ref 0.2–1.2)
Total Protein: 8.2 g/dL (ref 6.0–8.3)

## 2022-04-02 LAB — CBC WITH DIFFERENTIAL/PLATELET
Basophils Absolute: 0 10*3/uL (ref 0.0–0.1)
Basophils Relative: 0.3 % (ref 0.0–3.0)
Eosinophils Absolute: 0.4 10*3/uL (ref 0.0–0.7)
Eosinophils Relative: 5.5 % — ABNORMAL HIGH (ref 0.0–5.0)
HCT: 40.5 % (ref 36.0–46.0)
Hemoglobin: 13.5 g/dL (ref 12.0–15.0)
Lymphocytes Relative: 44 % (ref 12.0–46.0)
Lymphs Abs: 3.1 10*3/uL (ref 0.7–4.0)
MCHC: 33.4 g/dL (ref 30.0–36.0)
MCV: 92.4 fl (ref 78.0–100.0)
Monocytes Absolute: 0.5 10*3/uL (ref 0.1–1.0)
Monocytes Relative: 7.9 % (ref 3.0–12.0)
Neutro Abs: 3 10*3/uL (ref 1.4–7.7)
Neutrophils Relative %: 42.3 % — ABNORMAL LOW (ref 43.0–77.0)
Platelets: 313 10*3/uL (ref 150.0–400.0)
RBC: 4.38 Mil/uL (ref 3.87–5.11)
RDW: 13 % (ref 11.5–15.5)
WBC: 7 10*3/uL (ref 4.0–10.5)

## 2022-04-02 LAB — FOLATE: Folate: 8.4 ng/mL (ref >5.9)

## 2022-04-02 LAB — IBC PANEL
Iron: 107 ug/dL (ref 42–145)
Saturation Ratios: 20.8 % (ref 20.0–50.0)
TIBC: 515.2 ug/dL — ABNORMAL HIGH (ref 250.0–450.0)
Transferrin: 368 mg/dL — ABNORMAL HIGH (ref 212.0–360.0)

## 2022-04-02 LAB — VITAMIN D 25 HYDROXY (VIT D DEFICIENCY, FRACTURES): VITD: 13.22 ng/mL — ABNORMAL LOW (ref 30.00–100.00)

## 2022-04-02 LAB — FERRITIN: Ferritin: 110.9 ng/mL (ref 10.0–291.0)

## 2022-04-02 LAB — C-REACTIVE PROTEIN: CRP: <1 mg/dL (ref 0.5–20.0)

## 2022-04-02 LAB — LIPID PANEL
Cholesterol: 322 mg/dL — ABNORMAL HIGH (ref 0–200)
HDL: 48.9 mg/dL (ref >39.00)
NonHDL: 273.2
Total CHOL/HDL Ratio: 7
Triglycerides: 314 mg/dL — ABNORMAL HIGH (ref 0.0–149.0)
VLDL: 62.8 mg/dL — ABNORMAL HIGH (ref 0.0–40.0)

## 2022-04-02 LAB — LDL CHOLESTEROL, DIRECT: Direct LDL: 233 mg/dL

## 2022-04-02 LAB — HEMOGLOBIN A1C: Hgb A1c MFr Bld: 5.8 % (ref 4.6–6.5)

## 2022-04-02 LAB — TSH: TSH: 1.42 u[IU]/mL (ref 0.35–5.50)

## 2022-04-02 LAB — SEDIMENTATION RATE: Sed Rate: 47 mm/hr — ABNORMAL HIGH (ref 0–20)

## 2022-04-02 NOTE — Progress Notes (Signed)
Subjective:    Patient ID: Julie Anderson, adult    DOB: 1995-12-05, 26 y.o.   MRN: [id number]      HPI Julie Anderson is here for  Chief Complaint  Patient presents with   Joint Pain    Mom wanted her to get labs done - vit d, folate, iron, cholesterol.  This is a follow-up on her chronic issues.  She is not taking her vitamin on a daily basis.  She does have homocystinemia  When younger tested for thyroid and for arthritis.  She  gets joint pain and bone pain (forearms, shins).  Joint pain is intermittent - can be sharp or dull.  Sometimes her fingers feel swollen, but she is not sure if any specific joints are swollen.  She was concerned maybe she had a certain type of arthritis because her joint pain has not gone away.  She is trying to exercise.   Medications and allergies reviewed with patient and updated if appropriate.  Current Outpatient Medications on File Prior to Visit  Medication Sig Dispense Refill   etonogestrel (NEXPLANON) 68 MG IMPL implant 1 each by Subdermal route once. 3154 placed     Folic Acid-Vit M0-QQP Y19 (FOLBEE) 2.5-25-1 MG TABS tablet Take 1 tablet by mouth daily. Follow-up appt is due in must see provider for future refills 90 tablet 0   No current facility-administered medications on file prior to visit.    Review of Systems  Constitutional:  Negative for fever.  Respiratory:  Positive for cough (chronic). Negative for shortness of breath and wheezing.   Cardiovascular:  Negative for chest pain, palpitations and leg swelling.  Gastrointestinal: Negative.   Musculoskeletal:  Positive for arthralgias, back pain (bulging disc, myofascial pain syn) and myalgias.  Skin:  Negative for rash.  Neurological:  Negative for light-headedness and headaches.       Objective:   Vitals:   04/02/22 1523  BP: 120/80  Pulse: 89  Temp: 98.6 F (37 C)  SpO2: 97%   BP Readings from Last 3 Encounters:  04/02/22 120/80  03/31/21 118/68  12/05/20 108/80   Wt  Readings from Last 3 Encounters:  04/02/22 164 lb (74.4 kg)  03/31/21 154 lb (69.9 kg)  12/05/20 145 lb (65.8 kg)   Body mass index is 32.57 kg/m.    Physical Exam Constitutional:      General: Julie E Matters "Max" is not in acute distress.    Appearance: Normal appearance.  HENT:     Head: Normocephalic and atraumatic.  Eyes:     Conjunctiva/sclera: Conjunctivae normal.  Cardiovascular:     Rate and Rhythm: Normal rate and regular rhythm.     Heart sounds: Normal heart sounds. No murmur heard. Pulmonary:     Effort: Pulmonary effort is normal. No respiratory distress.     Breath sounds: Normal breath sounds. No wheezing.  Musculoskeletal:        General: No swelling or deformity.     Cervical back: Neck supple.     Right lower leg: No edema.     Left lower leg: No edema.  Lymphadenopathy:     Cervical: No cervical adenopathy.  Skin:    General: Skin is warm and dry.     Findings: No rash.  Neurological:     Mental Status: Julie Anderson "Max" is alert. Mental status is at baseline.  Psychiatric:        Mood and Affect: Mood normal.  Behavior: Behavior normal.            Assessment & Plan:    See Problem List for Assessment and Plan of chronic medical problems.

## 2022-04-02 NOTE — Assessment & Plan Note (Signed)
Chronic Not taking vitamin D Check vitamin D level

## 2022-04-02 NOTE — Assessment & Plan Note (Signed)
Chronic Regular exercise and healthy diet encouraged Check lipid panel, CMP, TSH Lifestyle controlled

## 2022-04-02 NOTE — Assessment & Plan Note (Signed)
Chronic Genetic Not taking her folic acid-vitamin G0-FVCBSWH B12 daily-stressed that she needs to be taking this on a daily basis We will check CBC, folic acid, iron

## 2022-04-02 NOTE — Assessment & Plan Note (Signed)
Chronic Check a1c Low sugar / carb diet Stressed regular exercise She was interested in possibly starting metformin-advised to see what the blood work shows and if her sugars are in the normal range she does not need to consider this at this time

## 2022-04-02 NOTE — Assessment & Plan Note (Signed)
Chronic States joint pain for a long time and often bone pain Has had some work-up when she was younger which was normal, but she does not recall what exactly they did Check ANA, ESR, CRP, RF, CCP Further evaluation depending on above blood work

## 2022-04-02 NOTE — Patient Instructions (Addendum)
  Blood work was ordered.     Medications changes include :   none     

## 2022-04-04 ENCOUNTER — Encounter: Payer: Self-pay | Admitting: Internal Medicine

## 2022-04-04 DIAGNOSIS — R768 Other specified abnormal immunological findings in serum: Secondary | ICD-10-CM

## 2022-04-04 DIAGNOSIS — R700 Elevated erythrocyte sedimentation rate: Secondary | ICD-10-CM

## 2022-04-04 DIAGNOSIS — M2550 Pain in unspecified joint: Secondary | ICD-10-CM

## 2022-04-04 LAB — ANTI-NUCLEAR AB-TITER (ANA TITER): ANA Titer 1: 1:80 {titer} — ABNORMAL HIGH

## 2022-04-04 LAB — CYCLIC CITRUL PEPTIDE ANTIBODY, IGG: Cyclic Citrullin Peptide Ab: <16 UNITS

## 2022-04-04 LAB — RHEUMATOID FACTOR: Rheumatoid fact SerPl-aCnc: <14 IU/mL (ref <14)

## 2022-04-04 LAB — ANA: Anti Nuclear Antibody (ANA): POSITIVE — AB

## 2022-04-07 DIAGNOSIS — F4310 Post-traumatic stress disorder, unspecified: Secondary | ICD-10-CM | POA: Diagnosis not present

## 2022-04-07 DIAGNOSIS — F333 Major depressive disorder, recurrent, severe with psychotic symptoms: Secondary | ICD-10-CM | POA: Diagnosis not present

## 2022-04-07 DIAGNOSIS — F411 Generalized anxiety disorder: Secondary | ICD-10-CM | POA: Diagnosis not present

## 2022-04-14 DIAGNOSIS — F4310 Post-traumatic stress disorder, unspecified: Secondary | ICD-10-CM | POA: Diagnosis not present

## 2022-04-14 DIAGNOSIS — F411 Generalized anxiety disorder: Secondary | ICD-10-CM | POA: Diagnosis not present

## 2022-04-14 DIAGNOSIS — F333 Major depressive disorder, recurrent, severe with psychotic symptoms: Secondary | ICD-10-CM | POA: Diagnosis not present

## 2022-04-21 DIAGNOSIS — F4310 Post-traumatic stress disorder, unspecified: Secondary | ICD-10-CM | POA: Diagnosis not present

## 2022-04-21 DIAGNOSIS — F411 Generalized anxiety disorder: Secondary | ICD-10-CM | POA: Diagnosis not present

## 2022-04-21 DIAGNOSIS — F333 Major depressive disorder, recurrent, severe with psychotic symptoms: Secondary | ICD-10-CM | POA: Diagnosis not present

## 2022-04-28 DIAGNOSIS — F4310 Post-traumatic stress disorder, unspecified: Secondary | ICD-10-CM | POA: Diagnosis not present

## 2022-04-28 DIAGNOSIS — F333 Major depressive disorder, recurrent, severe with psychotic symptoms: Secondary | ICD-10-CM | POA: Diagnosis not present

## 2022-04-28 DIAGNOSIS — F411 Generalized anxiety disorder: Secondary | ICD-10-CM | POA: Diagnosis not present

## 2022-05-05 DIAGNOSIS — F4310 Post-traumatic stress disorder, unspecified: Secondary | ICD-10-CM | POA: Diagnosis not present

## 2022-05-05 DIAGNOSIS — F333 Major depressive disorder, recurrent, severe with psychotic symptoms: Secondary | ICD-10-CM | POA: Diagnosis not present

## 2022-05-05 DIAGNOSIS — F411 Generalized anxiety disorder: Secondary | ICD-10-CM | POA: Diagnosis not present

## 2022-05-12 DIAGNOSIS — F4310 Post-traumatic stress disorder, unspecified: Secondary | ICD-10-CM | POA: Diagnosis not present

## 2022-05-12 DIAGNOSIS — F411 Generalized anxiety disorder: Secondary | ICD-10-CM | POA: Diagnosis not present

## 2022-05-12 DIAGNOSIS — F333 Major depressive disorder, recurrent, severe with psychotic symptoms: Secondary | ICD-10-CM | POA: Diagnosis not present

## 2022-05-19 DIAGNOSIS — F333 Major depressive disorder, recurrent, severe with psychotic symptoms: Secondary | ICD-10-CM | POA: Diagnosis not present

## 2022-05-19 DIAGNOSIS — F411 Generalized anxiety disorder: Secondary | ICD-10-CM | POA: Diagnosis not present

## 2022-05-19 DIAGNOSIS — F4310 Post-traumatic stress disorder, unspecified: Secondary | ICD-10-CM | POA: Diagnosis not present

## 2022-05-25 ENCOUNTER — Encounter: Payer: Self-pay | Admitting: Internal Medicine

## 2022-05-25 DIAGNOSIS — Z6831 Body mass index (BMI) 31.0-31.9, adult: Secondary | ICD-10-CM | POA: Diagnosis not present

## 2022-05-25 DIAGNOSIS — Z113 Encounter for screening for infections with a predominantly sexual mode of transmission: Secondary | ICD-10-CM | POA: Diagnosis not present

## 2022-05-25 DIAGNOSIS — Z01419 Encounter for gynecological examination (general) (routine) without abnormal findings: Secondary | ICD-10-CM | POA: Diagnosis not present

## 2022-05-26 DIAGNOSIS — F333 Major depressive disorder, recurrent, severe with psychotic symptoms: Secondary | ICD-10-CM | POA: Diagnosis not present

## 2022-05-26 DIAGNOSIS — F411 Generalized anxiety disorder: Secondary | ICD-10-CM | POA: Diagnosis not present

## 2022-05-26 DIAGNOSIS — F4310 Post-traumatic stress disorder, unspecified: Secondary | ICD-10-CM | POA: Diagnosis not present

## 2022-05-31 ENCOUNTER — Encounter: Payer: Self-pay | Admitting: Internal Medicine

## 2022-05-31 NOTE — Progress Notes (Signed)
Office Visit Note  Patient: Julie Anderson             Date of Birth: 1996/02/26           MRN: [id number]             PCP: Binnie Rail, MD Referring: Binnie Rail, MD Visit Date: 06/01/2022   Subjective:  New Patient (Initial Visit) (Abnormal lab results. Patient has joint pain and swelling. Dry itchy rash on right ankle.)   History of Present Illness: Julie Anderson is a 26 y.o. adult here for evaluation of positive ANA associated with joint and bone pains. They have a history of hyperhomocysteinemia and some previous evaluation for joint and bone problems several years ago with some mild joint problems and myofascial pain thought to be main contributors.  Overall reports joint pains and bone pains that have been ongoing for approximately 10 years duration.  Did have a fall sustained injury on the right elbow with diminished range of motion in that joint afterwards no other specific joint injuries and no joint surgeries.  He described pain that is pretty much constant but varying in intensity over time.  Seems to be worse under extreme stress or after overexertion.  Most often activity is limited by pain in upper extremities.  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  Wakes up with mild swelling in the hands some days can typically identify this due to wearing rings that will fit with varying level of tightness.  By the end of the day swelling is gone and symptoms are usually milder.  Less often has significant pains in the back right hip and knees affecting mobility.  Right hip pain is exacerbated with certain movements or exercises such as hip flexion under resistance.  Does not take any specific pain or anti-inflammatory medications regularly and reports previous medicines tried are not very effective for their pain. They have a history of dandruff since years ago have seen dermatology for monitoring a mole to also address this but topical  treatments were not very effective.  For over a year has a right ankle rash that is dry and itchy states constantly present, and more recently has facial rash below the right eye for a few months.  Labs reviewed 03/2022 ANA 1:80 speckled RF neg CCP neg ESR 47 CRP neg Vit D 13.22 CMP AST 42 ALT 68  Activities of Daily Living:  Patient reports morning stiffness for 30-60 minutes.   Patient Denies nocturnal pain.  Difficulty dressing/grooming: Denies Difficulty climbing stairs: Denies Difficulty getting out of chair: Denies Difficulty using hands for taps, buttons, cutlery, and/or writing: Denies  Review of Systems  Constitutional:  Negative for fatigue.  HENT:  Negative for mouth sores and mouth dryness.   Eyes:  Negative for dryness.  Respiratory:  Negative for shortness of breath.   Cardiovascular:  Negative for chest pain and palpitations.  Gastrointestinal:  Positive for constipation and diarrhea. Negative for blood in stool.  Endocrine: Negative for increased urination.  Genitourinary:  Negative for involuntary urination.  Musculoskeletal:  Positive for joint pain, joint pain, joint swelling and morning stiffness. Negative for gait problem, myalgias, muscle weakness, muscle tenderness and myalgias.  Skin:  Positive for rash. Negative for color change, hair loss and sensitivity to sunlight.  Allergic/Immunologic: Negative for susceptible to infections.  Neurological:  Negative for dizziness and headaches.  Hematological:  Negative for swollen glands.  Psychiatric/Behavioral:  Positive for depressed mood. Negative for sleep disturbance. The patient is nervous/anxious.     PMFS History:  Patient Active Problem List   Diagnosis Date Noted   Bilateral hand pain 06/01/2022   Positive ANA (antinuclear antibody) 06/01/2022   Bilateral elbow joint pain 06/01/2022   Pain in right hip 06/01/2022   Arthralgia 04/02/2022   History of pancreatitis 11/20/2020   Migraine without  status migrainosus, not intractable 07/19/2019   Right sided weakness 07/19/2019   Seborrheic dermatitis, unspecified 05/17/2018   Gender identity disorder 11/28/2017   Cough 09/14/2016   Hyperlipidemia 07/05/2016   Vitamin D deficiency 07/05/2016   Hyperglycemia 07/05/2016   Hyperhomocysteinemia ([place]) 07/05/2016   Hyperacusis 07/05/2016   Lumbar degenerative disc disease 06/25/2015   Myofascial pain syndrome 06/06/2015   ADD (attention deficit disorder) 09/24/2013   Anxiety and depression 07/12/2013   Auditory hallucination 07/12/2013   Allergic rhinitis 07/12/2013    Past Medical History:  Diagnosis Date   ADD (attention deficit disorder)    Anxiety    Bulging lumbar disc    Depression    Hyperhomocysteinemia ([place])    Misophonia     Family History  Adopted: Yes  Problem Relation Age of Onset   Alcohol abuse Father    Past Surgical History:  Procedure Laterality Date   CHOLECYSTECTOMY N/A 11/21/2020   Procedure: LAPAROSCOPIC CHOLECYSTECTOMY WITH INTRAOPERATIVE CHOLANGIOGRAM;  Surgeon: Dwan Bolt, MD;  Location: [facility];  Service: General;  Laterality: N/A;   Nexplanon Implant     TYMPANOSTOMY TUBE PLACEMENT     Social History   Social History Conservation officer, historic buildings    No recent travel   Immunization History  Administered Date(s) Administered   DTaP 04/04/1997, 08/06/1997, 12/20/1997, 05/23/1998, 03/27/2002   HIB (PRP-OMP) 03/05/1997, 05/15/1997, 08/06/1997, 05/23/1998   HPV Quadrivalent 05/25/2000, 06/16/2011, 08/09/2012   Hepatitis A 02/23/2006, 07/08/2008   Hepatitis B 03/05/1997, 04/04/1997, 05/15/1997, 12/06/2007   IPV 03/05/1997, 05/15/1997, 08/06/1997, 03/27/2002   Influenza,inj,Quad PF,6+ Mos 05/31/2016, 05/30/2017, 05/17/2018   Influenza-Unspecified 06/11/2015   MMR 03/28/1998, 03/27/2002   Meningococcal Conjugate 05/25/2010   PFIZER(Purple Top)SARS-COV-2 Vaccination 11/23/2019, 12/14/2019, 08/16/2020   Tdap 12/06/2007   Varicella  03/28/1998, 06/16/2011     Objective: Vital Signs: BP 108/74 (BP Location: Right Arm, Patient Position: Sitting, Cuff Size: Normal) Comment: Rod in left arm.  Pulse 78   Resp 14   Ht 5' (1.524 m)   Wt 159 lb 3.2 oz (72.2 kg)   LMP 05/30/2021 (Approximate)   BMI 31.09 kg/m    Physical Exam HENT:     Mouth/Throat:     Mouth: Mucous membranes are moist.     Pharynx: Oropharynx is clear.  Eyes:     Conjunctiva/sclera: Conjunctivae normal.  Cardiovascular:     Rate and Rhythm: Normal rate and regular rhythm.  Pulmonary:     Effort: Pulmonary effort is normal.     Breath sounds: Normal breath sounds.  Musculoskeletal:     Right lower leg: No edema.     Left lower leg: No edema.  Lymphadenopathy:     Cervical: No cervical adenopathy.  Skin:    General: Skin is warm and dry.     Findings: Rash present.     Comments: Small patch of mildly erythematous skin underneath medial border of right eye Approximately 1.5 cm diameter round rough slightly hyperpigmented skin on right lateral ankle  Neurological:     General: No focal deficit present.     Mental  Status: Antonietta Breach "Max" is alert.  Psychiatric:        Mood and Affect: Mood normal.      Musculoskeletal Exam:  Shoulders full ROM no tenderness or swelling Elbows right side extension limited slightly less than 180 degrees, left elbow hyperextensibility to greater than 190 degrees Wrists full ROM no tenderness or swelling Fingers full ROM no tenderness or swelling Right hip anterior pain provoked with external rotation, normal internal rotation, FABER and pelvic thrust test provoked right-sided low back pain Knees full ROM no tenderness or swelling Ankles full ROM no tenderness or swelling MTPs full ROM no tenderness or swelling   Investigation: No additional findings.  Imaging: XR Elbow 2 Views Right  Result Date: 06/02/2022 X-ray right elbow 2 views Humira radial humeral ulnar joint spaces appear normal.  No  visible anterior or posterior osteophytes seen.  No erosions, abnormal calcifications, or visible swelling seen. Impression Normal appearing elbow x-ray, no acute abnormality or structural changes to account for decreased range of motion  XR Elbow 2 Views Left  Result Date: 06/02/2022 X-ray elbow 2 views Humeral radial humeral ulnar joint spaces appear normal.  No visible anterior posterior osteophytes.  No visible joint swelling, erosions, or abnormal calcifications seen. Impression Normal elbow x-ray  XR Hand 2 View Right  Result Date: 06/02/2022 X-ray right hand 2 views Radiocarpal and carpal joint space appears normal.  MCP joint spaces appear normal.  PIP and DIP joints appear normal.  No erosions or abnormal calcifications seen.  Bone mineralization appears normal. Impression Normal hand x-ray  XR Hand 2 View Left  Result Date: 06/02/2022 X-ray left hand 2 views Radiocarpal and carpal joint spaces appear normal.  MCP joint spaces appear normal.  PIP and DIP joints appear normal.  No erosions or abnormal calcifications seen.  Bone mineralization appears normal. Impression Normal hand x-ray  XR HIP UNILAT W OR W/O PELVIS 2-3 VIEWS RIGHT  Result Date: 06/02/2022 X-ray right hip and pelvis 2 views Right hip joint appears normal with no joint space narrowing bone spurring or abnormal calcifications.  Left hip appears grossly normal.  Visualized portion of the lumbar spine suggestive for some rotation and angle of film.  Right SI joint possibly narrowing and some scleritis present compared to left SI joint but may be rotational effect. Impression Right hip joint appears normal, possible mild right sacroiliitis changes   Recent Labs: Lab Results  Component Value Date   WBC 7.0 04/02/2022   HGB 13.5 04/02/2022   PLT 313.0 04/02/2022   NA 138 04/02/2022   K 3.9 04/02/2022   CL 105 04/02/2022   CO2 25 04/02/2022   GLUCOSE 96 04/02/2022   BUN 16 04/02/2022   CREATININE 1.04 04/02/2022    BILITOT 0.5 04/02/2022   ALKPHOS 77 04/02/2022   AST 42 (H) 04/02/2022   ALT 68 (H) 04/02/2022   PROT 8.2 04/02/2022   ALBUMIN 4.8 04/02/2022   CALCIUM 10.0 04/02/2022   GFRAA 142 09/07/2020    Speciality Comments: No specialty comments available.  Procedures:  No procedures performed Allergies: Cymbalta [duloxetine hcl] and Dog epithelium allergy skin test   Assessment / Plan:     Visit Diagnoses: Positive ANA (antinuclear antibody) - Plan: Sedimentation rate, RNP Antibody, Anti-Smith antibody, Sjogrens syndrome-A extractable nuclear antibody, Anti-DNA antibody, double-stranded, C3 and C4  Joint pains in multiple areas and reported swelling though no synovitis on exam today along with positive ANA.  Skin rashes do not look typical for systemic connective  tissue disease.  Checking additional antibody panel as above and serum complements.  We will also recheck a sedimentation rate.  Do believe there is some confounding with a myofascial pain syndrome component possibly increasing pain relative to amount of inflammation.  Arthralgia, unspecified joint  Pain in right hip - Plan: XR HIP UNILAT W OR W/O PELVIS 2-3 VIEWS RIGHT  Range of motion appears pretty intact no specific pain provoked in the hip joint on exam.  Interestingly does have some pain on the right side in the very low back or over sacrum and pelvis x-ray is questionable for possible mild SI joint change.  Bilateral hand pain - Plan: XR Hand 2 View Right, XR Hand 2 View Left  No abnormal exam findings in both hands for synovitis or nodules.  X-ray of bilateral hands checked in clinic with no structural changes seen.  Bilateral elbow joint pain - Plan: XR Elbow 2 Views Right, XR Elbow 2 Views Left  Elbow pains but no tenderness or swelling on exam.  Right elbow does have a slightly decreased range of motion but apparently is chronic and unchanged since previous injury at that site.  X-ray of both elbows is without any  significant structural abnormalities.  Do not even see obvious changes such as anterior osteophyte around the elbow with limited extension.  Orders: Orders Placed This Encounter  Procedures   XR Hand 2 View Right   XR Hand 2 View Left   XR Elbow 2 Views Right   XR Elbow 2 Views Left   XR HIP UNILAT W OR W/O PELVIS 2-3 VIEWS RIGHT   Sedimentation rate   RNP Antibody   Anti-Smith antibody   Sjogrens syndrome-A extractable nuclear antibody   Anti-DNA antibody, double-stranded   C3 and C4   No orders of the defined types were placed in this encounter.    Follow-Up Instructions: Return in about 3 weeks (around 06/22/2022) for New pt +ANA f/u 3wks.   Collier Salina, MD  Note - This record has been created using [employer].  Chart creation errors have been sought, but may not always  have been located. Such creation errors do not reflect on  the standard of medical care.

## 2022-06-01 ENCOUNTER — Ambulatory Visit (INDEPENDENT_AMBULATORY_CARE_PROVIDER_SITE_OTHER): Payer: 59

## 2022-06-01 ENCOUNTER — Ambulatory Visit: Payer: 59 | Attending: Internal Medicine | Admitting: Internal Medicine

## 2022-06-01 ENCOUNTER — Encounter: Payer: Self-pay | Admitting: Internal Medicine

## 2022-06-01 VITALS — BP 108/74 | HR 78 | Resp 14 | Ht 60.0 in | Wt 159.2 lb

## 2022-06-01 DIAGNOSIS — M2550 Pain in unspecified joint: Secondary | ICD-10-CM

## 2022-06-01 DIAGNOSIS — M25522 Pain in left elbow: Secondary | ICD-10-CM

## 2022-06-01 DIAGNOSIS — M79641 Pain in right hand: Secondary | ICD-10-CM | POA: Insufficient documentation

## 2022-06-01 DIAGNOSIS — R768 Other specified abnormal immunological findings in serum: Secondary | ICD-10-CM

## 2022-06-01 DIAGNOSIS — M25521 Pain in right elbow: Secondary | ICD-10-CM

## 2022-06-01 DIAGNOSIS — M79642 Pain in left hand: Secondary | ICD-10-CM

## 2022-06-01 DIAGNOSIS — M25551 Pain in right hip: Secondary | ICD-10-CM

## 2022-06-02 DIAGNOSIS — F411 Generalized anxiety disorder: Secondary | ICD-10-CM | POA: Diagnosis not present

## 2022-06-02 DIAGNOSIS — F4310 Post-traumatic stress disorder, unspecified: Secondary | ICD-10-CM | POA: Diagnosis not present

## 2022-06-02 DIAGNOSIS — F333 Major depressive disorder, recurrent, severe with psychotic symptoms: Secondary | ICD-10-CM | POA: Diagnosis not present

## 2022-06-02 LAB — C3 AND C4
C3 Complement: 226 mg/dL — ABNORMAL HIGH (ref 83–193)
C4 Complement: 31 mg/dL (ref 15–57)

## 2022-06-02 LAB — SJOGRENS SYNDROME-A EXTRACTABLE NUCLEAR ANTIBODY: SSA (Ro) (ENA) Antibody, IgG: <1 AI

## 2022-06-02 LAB — ANTI-DNA ANTIBODY, DOUBLE-STRANDED: ds DNA Ab: <1 IU/mL

## 2022-06-02 LAB — ANTI-SMITH ANTIBODY: ENA SM Ab Ser-aCnc: <1 AI

## 2022-06-02 LAB — SEDIMENTATION RATE: Sed Rate: 31 mm/h — ABNORMAL HIGH (ref 0–20)

## 2022-06-02 LAB — RNP ANTIBODY: Ribonucleic Protein(ENA) Antibody, IgG: <1 AI

## 2022-06-09 DIAGNOSIS — F333 Major depressive disorder, recurrent, severe with psychotic symptoms: Secondary | ICD-10-CM | POA: Diagnosis not present

## 2022-06-09 DIAGNOSIS — F4310 Post-traumatic stress disorder, unspecified: Secondary | ICD-10-CM | POA: Diagnosis not present

## 2022-06-09 DIAGNOSIS — F411 Generalized anxiety disorder: Secondary | ICD-10-CM | POA: Diagnosis not present

## 2022-06-10 NOTE — Progress Notes (Signed)
Office Visit Note  Patient: Julie Anderson             Date of Birth: [birth year]           MRN: [id number]             PCP: Binnie Rail, MD Referring: Binnie Rail, MD Visit Date: 06/21/2022   Subjective:  Follow-up (Feeling about the same as last visit. )   History of Present Illness: Julie Anderson is a 26 y.o. adult here for follow up for evaluation of positive ANA associated with joint and bone pains.  Lab test at initial visit did demonstrate persistent mild sedimentation rate elevation at 31 and mildly elevated complement C3 of 226.  Specific extractable nuclear antibody tests were negative.  Overall she has not experienced any significant improvement or worsening in symptoms.  Previous HPI 06/01/2022 Julie Anderson is a 26 y.o. adult here for evaluation of positive ANA associated with joint and bone pains. They have a history of hyperhomocysteinemia and some previous evaluation for joint and bone problems several years ago with some mild joint problems and myofascial pain thought to be main contributors. Overall reports joint pains and bone pains that have been ongoing for approximately 10 years duration.  Did have a fall sustained injury on the right elbow with diminished range of motion in that joint afterwards no other specific joint injuries and no joint surgeries.  He described pain that is pretty much constant but varying in intensity over time.  Seems to be worse under extreme stress or after overexertion.  Most often activity is limited by pain in upper extremities.  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  Wakes up with mild swelling in the hands some days can typically identify this due to wearing rings that will fit with varying level of tightness.  By the end of the day swelling is gone and symptoms are usually milder.  Less often has significant pains in the back right hip and knees affecting mobility.  Right hip pain is  exacerbated with certain movements or exercises such as hip flexion under resistance.  Does not take any specific pain or anti-inflammatory medications regularly and reports previous medicines tried are not very effective for their pain. They have a history of dandruff since years ago have seen dermatology for monitoring a mole to also address this but topical treatments were not very effective.  For over a year has a right ankle rash that is dry and itchy states constantly present, and more recently has facial rash below the right eye for a few months.   Labs reviewed 03/2022 ANA 1:80 speckled RF neg CCP neg ESR 47 CRP neg Vit D 13.22 CMP AST 42 ALT 68   Review of Systems  Constitutional:  Positive for fatigue.  HENT:  Positive for mouth sores and mouth dryness.   Eyes:  Positive for dryness.  Respiratory:  Positive for shortness of breath.   Cardiovascular:  Negative for chest pain and palpitations.  Gastrointestinal:  Positive for constipation and diarrhea. Negative for blood in stool.  Endocrine: Negative for increased urination.  Genitourinary:  Negative for involuntary urination.  Musculoskeletal:  Positive for joint pain, joint pain, myalgias, morning stiffness, muscle tenderness and myalgias. Negative for gait problem, joint swelling and muscle weakness.  Skin:  Positive for rash. Negative for color change and hair loss.  Allergic/Immunologic: Negative for susceptible to infections.  Neurological:  Negative for dizziness and headaches.  Hematological:  Negative for swollen glands.  Psychiatric/Behavioral:  Positive for depressed mood. Negative for sleep disturbance. The patient is nervous/anxious.     PMFS History:  Patient Active Problem List   Diagnosis Date Noted   Bilateral hand pain 06/01/2022   Positive ANA (antinuclear antibody) 06/01/2022   Bilateral elbow joint pain 06/01/2022   Pain in right hip 06/01/2022   Arthralgia 04/02/2022   History of pancreatitis  11/20/2020   Migraine without status migrainosus, not intractable 07/19/2019   Right sided weakness 07/19/2019   Seborrheic dermatitis, unspecified 05/17/2018   Gender identity disorder 11/28/2017   Cough 09/14/2016   Hyperlipidemia 07/05/2016   Vitamin D deficiency 07/05/2016   Hyperglycemia 07/05/2016   Hyperhomocysteinemia ([place]) 07/05/2016   Hyperacusis 07/05/2016   Lumbar degenerative disc disease 06/25/2015   Myofascial pain syndrome 06/06/2015   ADD (attention deficit disorder) 09/24/2013   Anxiety and depression 07/12/2013   Auditory hallucination 07/12/2013   Allergic rhinitis 07/12/2013    Past Medical History:  Diagnosis Date   ADD (attention deficit disorder)    Anxiety    Bulging lumbar disc    Depression    Hyperhomocysteinemia ([place])    Misophonia     Family History  Adopted: Yes  Problem Relation Age of Onset   Alcohol abuse Father    Past Surgical History:  Procedure Laterality Date   CHOLECYSTECTOMY N/A 11/21/2020   Procedure: LAPAROSCOPIC CHOLECYSTECTOMY WITH INTRAOPERATIVE CHOLANGIOGRAM;  Surgeon: Dwan Bolt, MD;  Location: [facility];  Service: General;  Laterality: N/A;   Nexplanon Implant     TYMPANOSTOMY TUBE PLACEMENT     Social History   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   Immunization History  Administered Date(s) Administered   DTaP 04/04/1997, 08/06/1997, 12/20/1997, 05/23/1998, 03/27/2002   HIB (PRP-OMP) 03/05/1997, 05/15/1997, 08/06/1997, 05/23/1998   HPV Quadrivalent 05/25/2000, 06/16/2011, 08/09/2012   Hepatitis A 02/23/2006, 07/08/2008   Hepatitis B 03/05/1997, 04/04/1997, 05/15/1997, 12/06/2007   IPV 03/05/1997, 05/15/1997, 08/06/1997, 03/27/2002   Influenza,inj,Quad PF,6+ Mos 05/31/2016, 05/30/2017, 05/17/2018   Influenza-Unspecified 06/11/2015   MMR 03/28/1998, 03/27/2002   Meningococcal Conjugate 05/25/2010   PFIZER(Purple Top)SARS-COV-2 Vaccination 11/23/2019, 12/14/2019, 08/16/2020   Tdap  12/06/2007   Varicella 03/28/1998, 06/16/2011     Objective: Vital Signs: BP 106/72 (BP Location: Right Arm, Patient Position: Sitting, Cuff Size: Normal)   Pulse 85   Resp 15   Ht 5' (1.524 m)   Wt 159 lb 9.6 oz (72.4 kg)   LMP 05/30/2021 (Approximate)   BMI 31.17 kg/m    Physical Exam Cardiovascular:     Rate and Rhythm: Normal rate and regular rhythm.  Pulmonary:     Effort: Pulmonary effort is normal.     Breath sounds: Normal breath sounds.  Skin:    General: Skin is warm and dry.     Findings: No rash.  Psychiatric:        Mood and Affect: Mood normal.      Musculoskeletal Exam:  Elbows slightly restricted extension ROM on right side Wrists full ROM no tenderness or swelling Fingers full ROM no tenderness or swelling No paraspinal tenderness to palpation over upper and lower back Hip normal internal and external rotation without pain, no tenderness to lateral hip palpation Knees full ROM no tenderness or swelling Ankles full ROM no tenderness or swelling   Investigation: No additional findings.  Imaging: No results found.  Recent Labs: Lab Results  Component Value Date   WBC 7.0 04/02/2022   HGB 13.5 04/02/2022   PLT 313.0 04/02/2022   NA 138 04/02/2022   K 3.9 04/02/2022   CL 105 04/02/2022   CO2 25 04/02/2022   GLUCOSE 96 04/02/2022   BUN 16 04/02/2022   CREATININE 1.04 04/02/2022   BILITOT 0.5 04/02/2022   ALKPHOS 77 04/02/2022   AST 42 (H) 04/02/2022   ALT 68 (H) 04/02/2022   PROT 8.2 04/02/2022   ALBUMIN 4.8 04/02/2022   CALCIUM 10.0 04/02/2022   GFRAA 142 09/07/2020    Speciality Comments: No specialty comments available.  Procedures:  No procedures performed Allergies: Cymbalta [duloxetine hcl] and Dog epithelium allergy skin test   Assessment / Plan:     Visit Diagnoses: Positive ANA (antinuclear antibody) - Plan: hydroxychloroquine (PLAQUENIL) 200 MG tablet  Specific clinical criteria or radiographic findings but with  persistent positive ANA and elevated sedimentation rate joint pain and rashes recommend trial of hydroxychloroquine for possible undifferentiated connective tissue disease.  Starting hydroxychloroquine 200 mg daily.  Discussed risk of medication including drug intolerance QT prolongation and retinal toxicity monitoring if continuing long-term..  If no significant clinical improvement would recommend more observational approach with symptomatic management for now.  Arthralgia, unspecified joint - Plan: hydroxychloroquine (PLAQUENIL) 200 MG tablet Pain in right hip Bilateral hand pain Bilateral elbow joint pain  Arthralgias in multiple areas without significant inflammation on exam or structural problems on x-ray.  Trial of medication as above.  Also discussed as needed NSAIDs for symptom management. Orders: No orders of the defined types were placed in this encounter.  Meds ordered this encounter  Medications   hydroxychloroquine (PLAQUENIL) 200 MG tablet    Sig: Take 1 tablet (200 mg total) by mouth daily.    Dispense:  30 tablet    Refill:  2     Follow-Up Instructions: Return in about 3 months (around 09/21/2022) for ?RA try HCQ f/u 66mo.   CCollier Salina MD  Note - This record has been created using [employer]  Chart creation errors have been sought, but may not always  have been located. Such creation errors do not reflect on  the standard of medical care.

## 2022-06-16 DIAGNOSIS — F4310 Post-traumatic stress disorder, unspecified: Secondary | ICD-10-CM | POA: Diagnosis not present

## 2022-06-16 DIAGNOSIS — F411 Generalized anxiety disorder: Secondary | ICD-10-CM | POA: Diagnosis not present

## 2022-06-16 DIAGNOSIS — F333 Major depressive disorder, recurrent, severe with psychotic symptoms: Secondary | ICD-10-CM | POA: Diagnosis not present

## 2022-06-21 ENCOUNTER — Other Ambulatory Visit (HOSPITAL_COMMUNITY): Payer: Self-pay

## 2022-06-21 ENCOUNTER — Ambulatory Visit: Payer: 59 | Attending: Internal Medicine | Admitting: Internal Medicine

## 2022-06-21 ENCOUNTER — Encounter: Payer: Self-pay | Admitting: Internal Medicine

## 2022-06-21 VITALS — BP 106/72 | HR 85 | Resp 15 | Ht 60.0 in | Wt 159.6 lb

## 2022-06-21 DIAGNOSIS — M79642 Pain in left hand: Secondary | ICD-10-CM | POA: Diagnosis not present

## 2022-06-21 DIAGNOSIS — M79641 Pain in right hand: Secondary | ICD-10-CM | POA: Diagnosis not present

## 2022-06-21 DIAGNOSIS — M25522 Pain in left elbow: Secondary | ICD-10-CM

## 2022-06-21 DIAGNOSIS — M6281 Muscle weakness (generalized): Secondary | ICD-10-CM | POA: Diagnosis not present

## 2022-06-21 DIAGNOSIS — M2550 Pain in unspecified joint: Secondary | ICD-10-CM | POA: Diagnosis not present

## 2022-06-21 DIAGNOSIS — M25521 Pain in right elbow: Secondary | ICD-10-CM

## 2022-06-21 DIAGNOSIS — N9411 Superficial (introital) dyspareunia: Secondary | ICD-10-CM | POA: Diagnosis not present

## 2022-06-21 DIAGNOSIS — M62838 Other muscle spasm: Secondary | ICD-10-CM | POA: Diagnosis not present

## 2022-06-21 DIAGNOSIS — M6289 Other specified disorders of muscle: Secondary | ICD-10-CM | POA: Diagnosis not present

## 2022-06-21 DIAGNOSIS — R768 Other specified abnormal immunological findings in serum: Secondary | ICD-10-CM | POA: Diagnosis not present

## 2022-06-21 DIAGNOSIS — M25551 Pain in right hip: Secondary | ICD-10-CM | POA: Diagnosis not present

## 2022-06-21 MED ORDER — HYDROXYCHLOROQUINE SULFATE 200 MG PO TABS
200.0000 mg | ORAL_TABLET | Freq: Every day | ORAL | 2 refills | Status: DC
  Filled 2022-06-21: qty 30, 30d supply, fill #0
  Filled 2022-08-30: qty 30, 30d supply, fill #1
  Filled 2022-10-08: qty 30, 30d supply, fill #2

## 2022-06-23 DIAGNOSIS — F4310 Post-traumatic stress disorder, unspecified: Secondary | ICD-10-CM | POA: Diagnosis not present

## 2022-06-23 DIAGNOSIS — F411 Generalized anxiety disorder: Secondary | ICD-10-CM | POA: Diagnosis not present

## 2022-06-23 DIAGNOSIS — F333 Major depressive disorder, recurrent, severe with psychotic symptoms: Secondary | ICD-10-CM | POA: Diagnosis not present

## 2022-07-02 ENCOUNTER — Ambulatory Visit: Payer: 59 | Admitting: Internal Medicine

## 2022-07-07 DIAGNOSIS — F4310 Post-traumatic stress disorder, unspecified: Secondary | ICD-10-CM | POA: Diagnosis not present

## 2022-07-07 DIAGNOSIS — F333 Major depressive disorder, recurrent, severe with psychotic symptoms: Secondary | ICD-10-CM | POA: Diagnosis not present

## 2022-07-07 DIAGNOSIS — F411 Generalized anxiety disorder: Secondary | ICD-10-CM | POA: Diagnosis not present

## 2022-07-13 DIAGNOSIS — M62838 Other muscle spasm: Secondary | ICD-10-CM | POA: Diagnosis not present

## 2022-07-13 DIAGNOSIS — M6289 Other specified disorders of muscle: Secondary | ICD-10-CM | POA: Diagnosis not present

## 2022-07-13 DIAGNOSIS — N9411 Superficial (introital) dyspareunia: Secondary | ICD-10-CM | POA: Diagnosis not present

## 2022-07-13 DIAGNOSIS — M6281 Muscle weakness (generalized): Secondary | ICD-10-CM | POA: Diagnosis not present

## 2022-07-13 DIAGNOSIS — R102 Pelvic and perineal pain: Secondary | ICD-10-CM | POA: Diagnosis not present

## 2022-07-14 DIAGNOSIS — F411 Generalized anxiety disorder: Secondary | ICD-10-CM | POA: Diagnosis not present

## 2022-07-14 DIAGNOSIS — F333 Major depressive disorder, recurrent, severe with psychotic symptoms: Secondary | ICD-10-CM | POA: Diagnosis not present

## 2022-07-14 DIAGNOSIS — F4310 Post-traumatic stress disorder, unspecified: Secondary | ICD-10-CM | POA: Diagnosis not present

## 2022-07-27 DIAGNOSIS — R229 Localized swelling, mass and lump, unspecified: Secondary | ICD-10-CM | POA: Diagnosis not present

## 2022-07-28 DIAGNOSIS — F333 Major depressive disorder, recurrent, severe with psychotic symptoms: Secondary | ICD-10-CM | POA: Diagnosis not present

## 2022-07-28 DIAGNOSIS — F411 Generalized anxiety disorder: Secondary | ICD-10-CM | POA: Diagnosis not present

## 2022-07-28 DIAGNOSIS — F4310 Post-traumatic stress disorder, unspecified: Secondary | ICD-10-CM | POA: Diagnosis not present

## 2022-08-04 DIAGNOSIS — F4310 Post-traumatic stress disorder, unspecified: Secondary | ICD-10-CM | POA: Diagnosis not present

## 2022-08-04 DIAGNOSIS — F333 Major depressive disorder, recurrent, severe with psychotic symptoms: Secondary | ICD-10-CM | POA: Diagnosis not present

## 2022-08-04 DIAGNOSIS — F411 Generalized anxiety disorder: Secondary | ICD-10-CM | POA: Diagnosis not present

## 2022-08-11 DIAGNOSIS — R229 Localized swelling, mass and lump, unspecified: Secondary | ICD-10-CM | POA: Diagnosis not present

## 2022-08-17 DIAGNOSIS — M6281 Muscle weakness (generalized): Secondary | ICD-10-CM | POA: Diagnosis not present

## 2022-08-17 DIAGNOSIS — M62838 Other muscle spasm: Secondary | ICD-10-CM | POA: Diagnosis not present

## 2022-08-17 DIAGNOSIS — R102 Pelvic and perineal pain: Secondary | ICD-10-CM | POA: Diagnosis not present

## 2022-08-17 DIAGNOSIS — N9411 Superficial (introital) dyspareunia: Secondary | ICD-10-CM | POA: Diagnosis not present

## 2022-08-17 DIAGNOSIS — M6289 Other specified disorders of muscle: Secondary | ICD-10-CM | POA: Diagnosis not present

## 2022-09-14 DIAGNOSIS — N9411 Superficial (introital) dyspareunia: Secondary | ICD-10-CM | POA: Diagnosis not present

## 2022-09-14 DIAGNOSIS — R102 Pelvic and perineal pain: Secondary | ICD-10-CM | POA: Diagnosis not present

## 2022-09-14 DIAGNOSIS — M62838 Other muscle spasm: Secondary | ICD-10-CM | POA: Diagnosis not present

## 2022-09-14 DIAGNOSIS — M6281 Muscle weakness (generalized): Secondary | ICD-10-CM | POA: Diagnosis not present

## 2022-09-14 DIAGNOSIS — M6289 Other specified disorders of muscle: Secondary | ICD-10-CM | POA: Diagnosis not present

## 2022-09-15 DIAGNOSIS — F4310 Post-traumatic stress disorder, unspecified: Secondary | ICD-10-CM | POA: Diagnosis not present

## 2022-09-15 DIAGNOSIS — F411 Generalized anxiety disorder: Secondary | ICD-10-CM | POA: Diagnosis not present

## 2022-09-15 DIAGNOSIS — F333 Major depressive disorder, recurrent, severe with psychotic symptoms: Secondary | ICD-10-CM | POA: Diagnosis not present

## 2022-09-21 NOTE — Progress Notes (Deleted)
Office Visit Note  Patient: Julie Anderson             Date of Birth: December 09, 1995           MRN: [id number]             PCP: Binnie Rail, MD Referring: Binnie Rail, MD Visit Date: 09/22/2022   Subjective:  No chief complaint on file.   History of Present Illness: Julie Anderson is a 27 y.o. adult here for follow up ***   Previous HPI 06/21/22 Julie Anderson is a 27 y.o. adult here for follow up for evaluation of positive ANA associated with joint and bone pains.  Lab test at initial visit did demonstrate persistent mild sedimentation rate elevation at 31 and mildly elevated complement C3 of 226. Specific extractable nuclear antibody tests were negative.  Overall she has not experienced any significant improvement or worsening in symptoms.   Previous HPI 06/01/2022 Julie Anderson is a 27 y.o. adult here for evaluation of positive ANA associated with joint and bone pains. They have a history of hyperhomocysteinemia and some previous evaluation for joint and bone problems several years ago with some mild joint problems and myofascial pain thought to be main contributors. Overall reports joint pains and bone pains that have been ongoing for approximately 10 years duration.  Did have a fall sustained injury on the right elbow with diminished range of motion in that joint afterwards no other specific joint injuries and no joint surgeries.  He described pain that is pretty much constant but varying in intensity over time.  Seems to be worse under extreme stress or after overexertion.  Most often activity is limited by pain in upper extremities.  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  Wakes up with mild swelling in the hands some days can typically identify this due to wearing rings that will fit with varying level of tightness.  By the end of the day swelling is gone and symptoms are usually milder.  Less often has significant pains in the back right  hip and knees affecting mobility.  Right hip pain is exacerbated with certain movements or exercises such as hip flexion under resistance.  Does not take any specific pain or anti-inflammatory medications regularly and reports previous medicines tried are not very effective for their pain. They have a history of dandruff since years ago have seen dermatology for monitoring a mole to also address this but topical treatments were not very effective.  For over a year has a right ankle rash that is dry and itchy states constantly present, and more recently has facial rash below the right eye for a few months.   Labs reviewed 03/2022 ANA 1:80 speckled RF neg CCP neg ESR 47 CRP neg Vit D 13.22 CMP AST 42 ALT 68   No Rheumatology ROS completed.   PMFS History:  Patient Active Problem List   Diagnosis Date Noted   Bilateral hand pain 06/01/2022   Positive ANA (antinuclear antibody) 06/01/2022   Bilateral elbow joint pain 06/01/2022   Pain in right hip 06/01/2022   Arthralgia 04/02/2022   History of pancreatitis 11/20/2020   Migraine without status migrainosus, not intractable 07/19/2019   Right sided weakness 07/19/2019   Seborrheic dermatitis, unspecified 05/17/2018   Gender identity disorder 11/28/2017   Cough 09/14/2016   Hyperlipidemia 07/05/2016   Vitamin D deficiency 07/05/2016   Hyperglycemia 07/05/2016  Hyperhomocysteinemia ([place]) 07/05/2016   Hyperacusis 07/05/2016   Lumbar degenerative disc disease 06/25/2015   Myofascial pain syndrome 06/06/2015   ADD (attention deficit disorder) 09/24/2013   Anxiety and depression 07/12/2013   Auditory hallucination 07/12/2013   Allergic rhinitis 07/12/2013    Past Medical History:  Diagnosis Date   ADD (attention deficit disorder)    Anxiety    Bulging lumbar disc    Depression    Hyperhomocysteinemia ([place])    Misophonia     Family History  Adopted: Yes  Problem Relation Age of Onset   Alcohol abuse Father    Past Surgical  History:  Procedure Laterality Date   CHOLECYSTECTOMY N/A 11/21/2020   Procedure: LAPAROSCOPIC CHOLECYSTECTOMY WITH INTRAOPERATIVE CHOLANGIOGRAM;  Surgeon: Dwan Bolt, MD;  Location: [facility];  Service: General;  Laterality: N/A;   Nexplanon Implant     TYMPANOSTOMY TUBE PLACEMENT     Social History   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   Immunization History  Administered Date(s) Administered   DTaP 04/04/1997, 08/06/1997, 12/20/1997, 05/23/1998, 03/27/2002   HIB (PRP-OMP) 03/05/1997, 05/15/1997, 08/06/1997, 05/23/1998   HPV Quadrivalent 05/25/2000, 06/16/2011, 08/09/2012   Hepatitis A 02/23/2006, 07/08/2008   Hepatitis B 03/05/1997, 04/04/1997, 05/15/1997, 12/06/2007   IPV 03/05/1997, 05/15/1997, 08/06/1997, 03/27/2002   Influenza,inj,Quad PF,6+ Mos 05/31/2016, 05/30/2017, 05/17/2018   Influenza-Unspecified 06/11/2015   MMR 03/28/1998, 03/27/2002   Meningococcal Conjugate 05/25/2010   PFIZER(Purple Top)SARS-COV-2 Vaccination 11/23/2019, 12/14/2019, 08/16/2020   Tdap 12/06/2007   Varicella 03/28/1998, 06/16/2011     Objective: Vital Signs: There were no vitals taken for this visit.   Physical Exam   Musculoskeletal Exam: ***  CDAI Exam: CDAI Score: -- Patient Global: --; Provider Global: -- Swollen: --; Tender: -- Joint Exam 09/22/2022   No joint exam has been documented for this visit   There is currently no information documented on the homunculus. Go to the Rheumatology activity and complete the homunculus joint exam.  Investigation: No additional findings.  Imaging: No results found.  Recent Labs: Lab Results  Component Value Date   WBC 7.0 04/02/2022   HGB 13.5 04/02/2022   PLT 313.0 04/02/2022   NA 138 04/02/2022   K 3.9 04/02/2022   CL 105 04/02/2022   CO2 25 04/02/2022   GLUCOSE 96 04/02/2022   BUN 16 04/02/2022   CREATININE 1.04 04/02/2022   BILITOT 0.5 04/02/2022   ALKPHOS 77 04/02/2022   AST 42 (H)  04/02/2022   ALT 68 (H) 04/02/2022   PROT 8.2 04/02/2022   ALBUMIN 4.8 04/02/2022   CALCIUM 10.0 04/02/2022   GFRAA 142 09/07/2020    Speciality Comments: No specialty comments available.  Procedures:  No procedures performed Allergies: Cymbalta [duloxetine hcl] and Dog epithelium allergy skin test   Assessment / Plan:     Visit Diagnoses: No diagnosis found.  ***  Orders: No orders of the defined types were placed in this encounter.  No orders of the defined types were placed in this encounter.    Follow-Up Instructions: No follow-ups on file.   Collier Salina, MD  Note - This record has been created using [employer].  Chart creation errors have been sought, but may not always  have been located. Such creation errors do not reflect on  the standard of medical care.

## 2022-09-22 ENCOUNTER — Ambulatory Visit: Payer: Commercial Managed Care - PPO | Admitting: Internal Medicine

## 2022-09-22 DIAGNOSIS — F333 Major depressive disorder, recurrent, severe with psychotic symptoms: Secondary | ICD-10-CM | POA: Diagnosis not present

## 2022-09-22 DIAGNOSIS — F4310 Post-traumatic stress disorder, unspecified: Secondary | ICD-10-CM | POA: Diagnosis not present

## 2022-09-22 DIAGNOSIS — F411 Generalized anxiety disorder: Secondary | ICD-10-CM | POA: Diagnosis not present

## 2022-09-28 DIAGNOSIS — F329 Major depressive disorder, single episode, unspecified: Secondary | ICD-10-CM | POA: Diagnosis not present

## 2022-10-04 DIAGNOSIS — R102 Pelvic and perineal pain: Secondary | ICD-10-CM | POA: Diagnosis not present

## 2022-10-04 DIAGNOSIS — M62838 Other muscle spasm: Secondary | ICD-10-CM | POA: Diagnosis not present

## 2022-10-04 DIAGNOSIS — N9411 Superficial (introital) dyspareunia: Secondary | ICD-10-CM | POA: Diagnosis not present

## 2022-10-04 DIAGNOSIS — M6289 Other specified disorders of muscle: Secondary | ICD-10-CM | POA: Diagnosis not present

## 2022-10-04 DIAGNOSIS — M6281 Muscle weakness (generalized): Secondary | ICD-10-CM | POA: Diagnosis not present

## 2022-10-07 DIAGNOSIS — F329 Major depressive disorder, single episode, unspecified: Secondary | ICD-10-CM | POA: Diagnosis not present

## 2022-10-21 DIAGNOSIS — F329 Major depressive disorder, single episode, unspecified: Secondary | ICD-10-CM | POA: Diagnosis not present

## 2022-10-23 NOTE — Progress Notes (Deleted)
Office Visit Note  Patient: Julie Anderson             Date of Birth: 1995/10/27           MRN: [id number]             PCP: Binnie Rail, MD Referring: Binnie Rail, MD Visit Date: 10/25/2022   Subjective:  No chief complaint on file.   History of Present Illness: Julie Anderson is a 27 y.o. adult here for follow up ***   Previous HPI 06/21/22 Julie Anderson is a 27 y.o. adult here for follow up for evaluation of positive ANA associated with joint and bone pains.  Lab test at initial visit did demonstrate persistent mild sedimentation rate elevation at 31 and mildly elevated complement C3 of 226.  Specific extractable nuclear antibody tests were negative.  Overall she has not experienced any significant improvement or worsening in symptoms.   Previous HPI 06/01/2022 Julie Anderson is a 27 y.o. adult here for evaluation of positive ANA associated with joint and bone pains. They have a history of hyperhomocysteinemia and some previous evaluation for joint and bone problems several years ago with some mild joint problems and myofascial pain thought to be main contributors. Overall reports joint pains and bone pains that have been ongoing for approximately 10 years duration.  Did have a fall sustained injury on the right elbow with diminished range of motion in that joint afterwards no other specific joint injuries and no joint surgeries.  He described pain that is pretty much constant but varying in intensity over time.  Seems to be worse under extreme stress or after overexertion.  Most often activity is limited by pain in upper extremities.  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  Wakes up with mild swelling in the hands some days can typically identify this due to wearing rings that will fit with varying level of tightness.  By the end of the day swelling is gone and symptoms are usually milder.  Less often has significant pains in the back right  hip and knees affecting mobility.  Right hip pain is exacerbated with certain movements or exercises such as hip flexion under resistance.  Does not take any specific pain or anti-inflammatory medications regularly and reports previous medicines tried are not very effective for their pain. They have a history of dandruff since years ago have seen dermatology for monitoring a mole to also address this but topical treatments were not very effective.  For over a year has a right ankle rash that is dry and itchy states constantly present, and more recently has facial rash below the right eye for a few months.   Labs reviewed 03/2022 ANA 1:80 speckled RF neg CCP neg ESR 47 CRP neg Vit D 13.22 CMP AST 42 ALT 68   No Rheumatology ROS completed.   PMFS History:  Patient Active Problem List   Diagnosis Date Noted   Bilateral hand pain 06/01/2022   Positive ANA (antinuclear antibody) 06/01/2022   Bilateral elbow joint pain 06/01/2022   Pain in right hip 06/01/2022   Arthralgia 04/02/2022   History of pancreatitis 11/20/2020   Migraine without status migrainosus, not intractable 07/19/2019   Right sided weakness 07/19/2019   Seborrheic dermatitis, unspecified 05/17/2018   Gender identity disorder 11/28/2017   Cough 09/14/2016   Hyperlipidemia 07/05/2016   Vitamin D deficiency 07/05/2016   Hyperglycemia 07/05/2016  Hyperhomocysteinemia ([place]) 07/05/2016   Hyperacusis 07/05/2016   Lumbar degenerative disc disease 06/25/2015   Myofascial pain syndrome 06/06/2015   ADD (attention deficit disorder) 09/24/2013   Anxiety and depression 07/12/2013   Auditory hallucination 07/12/2013   Allergic rhinitis 07/12/2013    Past Medical History:  Diagnosis Date   ADD (attention deficit disorder)    Anxiety    Bulging lumbar disc    Depression    Hyperhomocysteinemia ([place])    Misophonia     Family History  Adopted: Yes  Problem Relation Age of Onset   Alcohol abuse Father    Past Surgical  History:  Procedure Laterality Date   CHOLECYSTECTOMY N/A 11/21/2020   Procedure: LAPAROSCOPIC CHOLECYSTECTOMY WITH INTRAOPERATIVE CHOLANGIOGRAM;  Surgeon: Dwan Bolt, MD;  Location: [facility];  Service: General;  Laterality: N/A;   Nexplanon Implant     TYMPANOSTOMY TUBE PLACEMENT     Social History   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   Immunization History  Administered Date(s) Administered   DTaP 04/04/1997, 08/06/1997, 12/20/1997, 05/23/1998, 03/27/2002   HIB (PRP-OMP) 03/05/1997, 05/15/1997, 08/06/1997, 05/23/1998   HPV Quadrivalent 05/25/2000, 06/16/2011, 08/09/2012   Hepatitis A 02/23/2006, 07/08/2008   Hepatitis B 03/05/1997, 04/04/1997, 05/15/1997, 12/06/2007   IPV 03/05/1997, 05/15/1997, 08/06/1997, 03/27/2002   Influenza,inj,Quad PF,6+ Mos 05/31/2016, 05/30/2017, 05/17/2018   Influenza-Unspecified 06/11/2015   MMR 03/28/1998, 03/27/2002   Meningococcal Conjugate 05/25/2010   PFIZER(Purple Top)SARS-COV-2 Vaccination 11/23/2019, 12/14/2019, 08/16/2020   Tdap 12/06/2007   Varicella 03/28/1998, 06/16/2011     Objective: Vital Signs: There were no vitals taken for this visit.   Physical Exam   Musculoskeletal Exam: ***  CDAI Exam: CDAI Score: -- Patient Global: --; Provider Global: -- Swollen: --; Tender: -- Joint Exam 10/25/2022   No joint exam has been documented for this visit   There is currently no information documented on the homunculus. Go to the Rheumatology activity and complete the homunculus joint exam.  Investigation: No additional findings.  Imaging: No results found.  Recent Labs: Lab Results  Component Value Date   WBC 7.0 04/02/2022   HGB 13.5 04/02/2022   PLT 313.0 04/02/2022   NA 138 04/02/2022   K 3.9 04/02/2022   CL 105 04/02/2022   CO2 25 04/02/2022   GLUCOSE 96 04/02/2022   BUN 16 04/02/2022   CREATININE 1.04 04/02/2022   BILITOT 0.5 04/02/2022   ALKPHOS 77 04/02/2022   AST 42 (H)  04/02/2022   ALT 68 (H) 04/02/2022   PROT 8.2 04/02/2022   ALBUMIN 4.8 04/02/2022   CALCIUM 10.0 04/02/2022   GFRAA 142 09/07/2020    Speciality Comments: No specialty comments available.  Procedures:  No procedures performed Allergies: Cymbalta [duloxetine hcl] and Dog epithelium allergy skin test   Assessment / Plan:     Visit Diagnoses: No diagnosis found.  ***  Orders: No orders of the defined types were placed in this encounter.  No orders of the defined types were placed in this encounter.    Follow-Up Instructions: No follow-ups on file.   Collier Salina, MD  Note - This record has been created using [employer].  Chart creation errors have been sought, but may not always  have been located. Such creation errors do not reflect on  the standard of medical care.

## 2022-10-25 ENCOUNTER — Ambulatory Visit: Payer: Commercial Managed Care - PPO | Admitting: Internal Medicine

## 2022-10-28 DIAGNOSIS — N9411 Superficial (introital) dyspareunia: Secondary | ICD-10-CM | POA: Diagnosis not present

## 2022-10-28 DIAGNOSIS — M62838 Other muscle spasm: Secondary | ICD-10-CM | POA: Diagnosis not present

## 2022-10-28 DIAGNOSIS — M6281 Muscle weakness (generalized): Secondary | ICD-10-CM | POA: Diagnosis not present

## 2022-10-28 DIAGNOSIS — M6289 Other specified disorders of muscle: Secondary | ICD-10-CM | POA: Diagnosis not present

## 2022-10-28 DIAGNOSIS — R102 Pelvic and perineal pain: Secondary | ICD-10-CM | POA: Diagnosis not present

## 2022-11-03 DIAGNOSIS — F329 Major depressive disorder, single episode, unspecified: Secondary | ICD-10-CM | POA: Diagnosis not present

## 2022-11-08 NOTE — Progress Notes (Deleted)
Office Visit Note  Patient: Julie Anderson             Date of Birth: 1995/10/15           MRN: [id number]             PCP: Binnie Rail, MD Referring: Binnie Rail, MD Visit Date: 11/09/2022   Subjective:  No chief complaint on file.   History of Present Illness: Julie Anderson is a 27 y.o. adult here for follow up ***   Previous HPI 06/21/22 Julie Anderson is a 28 y.o. adult here for follow up for evaluation of positive ANA associated with joint and bone pains.  Lab test at initial visit did demonstrate persistent mild sedimentation rate elevation at 31 and mildly elevated complement C3 of 226.  Specific extractable nuclear antibody tests were negative.  Overall she has not experienced any significant improvement or worsening in symptoms.   Previous HPI 06/01/2022 Julie Anderson is a 27 y.o. adult here for evaluation of positive ANA associated with joint and bone pains. They have a history of hyperhomocysteinemia and some previous evaluation for joint and bone problems several years ago with some mild joint problems and myofascial pain thought to be main contributors. Overall reports joint pains and bone pains that have been ongoing for approximately 10 years duration.  Did have a fall sustained injury on the right elbow with diminished range of motion in that joint afterwards no other specific joint injuries and no joint surgeries.  He described pain that is pretty much constant but varying in intensity over time.  Seems to be worse under extreme stress or after overexertion.  Most often activity is limited by pain in upper extremities.  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  Wakes up with mild swelling in the hands some days can typically identify this due to wearing rings that will fit with varying level of tightness.  By the end of the day swelling is gone and symptoms are usually milder.  Less often has significant pains in the back right  hip and knees affecting mobility.  Right hip pain is exacerbated with certain movements or exercises such as hip flexion under resistance.  Does not take any specific pain or anti-inflammatory medications regularly and reports previous medicines tried are not very effective for their pain. They have a history of dandruff since years ago have seen dermatology for monitoring a mole to also address this but topical treatments were not very effective.  For over a year has a right ankle rash that is dry and itchy states constantly present, and more recently has facial rash below the right eye for a few months.   Labs reviewed 03/2022 ANA 1:80 speckled RF neg CCP neg ESR 47 CRP neg Vit D 13.22 CMP AST 42 ALT 68   No Rheumatology ROS completed.   PMFS History:  Patient Active Problem List   Diagnosis Date Noted   Bilateral hand pain 06/01/2022   Positive ANA (antinuclear antibody) 06/01/2022   Bilateral elbow joint pain 06/01/2022   Pain in right hip 06/01/2022   Arthralgia 04/02/2022   History of pancreatitis 11/20/2020   Migraine without status migrainosus, not intractable 07/19/2019   Right sided weakness 07/19/2019   Seborrheic dermatitis, unspecified 05/17/2018   Gender identity disorder 11/28/2017   Cough 09/14/2016   Hyperlipidemia 07/05/2016   Vitamin D deficiency 07/05/2016   Hyperglycemia 07/05/2016  Hyperhomocysteinemia ([place]) 07/05/2016   Hyperacusis 07/05/2016   Lumbar degenerative disc disease 06/25/2015   Myofascial pain syndrome 06/06/2015   ADD (attention deficit disorder) 09/24/2013   Anxiety and depression 07/12/2013   Auditory hallucination 07/12/2013   Allergic rhinitis 07/12/2013    Past Medical History:  Diagnosis Date   ADD (attention deficit disorder)    Anxiety    Bulging lumbar disc    Depression    Hyperhomocysteinemia ([place])    Misophonia     Family History  Adopted: Yes  Problem Relation Age of Onset   Alcohol abuse Father    Past Surgical  History:  Procedure Laterality Date   CHOLECYSTECTOMY N/A 11/21/2020   Procedure: LAPAROSCOPIC CHOLECYSTECTOMY WITH INTRAOPERATIVE CHOLANGIOGRAM;  Surgeon: Dwan Bolt, MD;  Location: [facility];  Service: General;  Laterality: N/A;   Nexplanon Implant     TYMPANOSTOMY TUBE PLACEMENT     Social History   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   Immunization History  Administered Date(s) Administered   DTaP 04/04/1997, 08/06/1997, 12/20/1997, 05/23/1998, 03/27/2002   HIB (PRP-OMP) 03/05/1997, 05/15/1997, 08/06/1997, 05/23/1998   HPV Quadrivalent 05/25/2000, 06/16/2011, 08/09/2012   Hepatitis A 02/23/2006, 07/08/2008   Hepatitis B 03/05/1997, 04/04/1997, 05/15/1997, 12/06/2007   IPV 03/05/1997, 05/15/1997, 08/06/1997, 03/27/2002   Influenza,inj,Quad PF,6+ Mos 05/31/2016, 05/30/2017, 05/17/2018   Influenza-Unspecified 06/11/2015   MMR 03/28/1998, 03/27/2002   Meningococcal Conjugate 05/25/2010   PFIZER(Purple Top)SARS-COV-2 Vaccination 11/23/2019, 12/14/2019, 08/16/2020   Tdap 12/06/2007   Varicella 03/28/1998, 06/16/2011     Objective: Vital Signs: There were no vitals taken for this visit.   Physical Exam   Musculoskeletal Exam: ***  CDAI Exam: CDAI Score: -- Patient Global: --; Provider Global: -- Swollen: --; Tender: -- Joint Exam 11/09/2022   No joint exam has been documented for this visit   There is currently no information documented on the homunculus. Go to the Rheumatology activity and complete the homunculus joint exam.  Investigation: No additional findings.  Imaging: No results found.  Recent Labs: Lab Results  Component Value Date   WBC 7.0 04/02/2022   HGB 13.5 04/02/2022   PLT 313.0 04/02/2022   NA 138 04/02/2022   K 3.9 04/02/2022   CL 105 04/02/2022   CO2 25 04/02/2022   GLUCOSE 96 04/02/2022   BUN 16 04/02/2022   CREATININE 1.04 04/02/2022   BILITOT 0.5 04/02/2022   ALKPHOS 77 04/02/2022   AST 42 (H)  04/02/2022   ALT 68 (H) 04/02/2022   PROT 8.2 04/02/2022   ALBUMIN 4.8 04/02/2022   CALCIUM 10.0 04/02/2022   GFRAA 142 09/07/2020    Speciality Comments: No specialty comments available.  Procedures:  No procedures performed Allergies: Cymbalta [duloxetine hcl] and Dog epithelium (canis lupus familiaris)   Assessment / Plan:     Visit Diagnoses: No diagnosis found.  ***  Orders: No orders of the defined types were placed in this encounter.  No orders of the defined types were placed in this encounter.    Follow-Up Instructions: No follow-ups on file.   Collier Salina, MD  Note - This record has been created using [employer].  Chart creation errors have been sought, but may not always  have been located. Such creation errors do not reflect on  the standard of medical care.

## 2022-11-09 ENCOUNTER — Ambulatory Visit: Payer: Commercial Managed Care - PPO | Admitting: Internal Medicine

## 2022-11-09 DIAGNOSIS — R102 Pelvic and perineal pain: Secondary | ICD-10-CM | POA: Diagnosis not present

## 2022-11-09 DIAGNOSIS — N9411 Superficial (introital) dyspareunia: Secondary | ICD-10-CM | POA: Diagnosis not present

## 2022-11-09 DIAGNOSIS — M6281 Muscle weakness (generalized): Secondary | ICD-10-CM | POA: Diagnosis not present

## 2022-11-09 DIAGNOSIS — M62838 Other muscle spasm: Secondary | ICD-10-CM | POA: Diagnosis not present

## 2022-11-09 DIAGNOSIS — M6289 Other specified disorders of muscle: Secondary | ICD-10-CM | POA: Diagnosis not present

## 2022-11-11 DIAGNOSIS — F329 Major depressive disorder, single episode, unspecified: Secondary | ICD-10-CM | POA: Diagnosis not present

## 2022-11-15 ENCOUNTER — Other Ambulatory Visit (HOSPITAL_COMMUNITY): Payer: Self-pay

## 2022-11-15 ENCOUNTER — Ambulatory Visit
Admission: EM | Admit: 2022-11-15 | Discharge: 2022-11-15 | Disposition: A | Discharge status: Home / Self Care | Payer: Commercial Managed Care - PPO | Attending: Nurse Practitioner | Admitting: Nurse Practitioner

## 2022-11-15 DIAGNOSIS — R112 Nausea with vomiting, unspecified: Secondary | ICD-10-CM | POA: Diagnosis not present

## 2022-11-15 DIAGNOSIS — A084 Viral intestinal infection, unspecified: Secondary | ICD-10-CM | POA: Diagnosis not present

## 2022-11-15 MED ORDER — ONDANSETRON 4 MG PO TBDP
4.0000 mg | ORAL_TABLET | Freq: Three times a day (TID) | ORAL | 0 refills | Status: DC | PRN
  Filled 2022-11-15: qty 20, 7d supply, fill #0

## 2022-11-15 MED ORDER — ONDANSETRON 4 MG PO TBDP
4.0000 mg | ORAL_TABLET | Freq: Once | ORAL | Status: AC
  Administered 2022-11-15: 4 mg via ORAL

## 2022-11-15 NOTE — Discharge Instructions (Signed)
Zofran as needed for nausea and vomiting Try to maintain your hydration with Gatorade, Powerade, Pedialyte, water Brat diet and advance as tolerated Rest Please follow-up with your PCP if your symptoms do not improve Please go to the emergency room if you develop any worsening symptoms

## 2022-11-15 NOTE — ED Triage Notes (Signed)
Pt presents with c/o not being able to keep food down x 5 days.   States all they had was miso soup, states they have stomach pain and has pain when moving and c/o nausea.

## 2022-11-15 NOTE — ED Provider Notes (Signed)
[facility]    CSN: [id number] Arrival date & time: 11/15/22  0910      History   Chief Complaint Chief Complaint  Patient presents with   Diarrhea   Nausea   Emesis    HPI Julie Anderson is a 27 y.o. adult presents for evaluation of nausea/vomit/diarrhea.  Patient reports 5 days of nausea with nonbloody diarrhea and 1 day of nonbloody vomit.  Does endorse some abdominal cramping but denies fevers, chills, body aches, URI symptoms.  No history of IBS, Crohn's, diverticulitis.  Abdominal surgeries include cholecystectomy.  No recent travel.  No known sick contacts.  She states she is able to stay hydrated and is urinating normally.  Has not taken any OTC medications for symptoms.  No other concerns at this time.   Diarrhea Associated symptoms: abdominal pain and vomiting   Emesis Associated symptoms: abdominal pain and diarrhea     Past Medical History:  Diagnosis Date   ADD (attention deficit disorder)    Anxiety    Bulging lumbar disc    Depression    Hyperhomocysteinemia ([place])    Misophonia     Patient Active Problem List   Diagnosis Date Noted   Bilateral hand pain 06/01/2022   Positive ANA (antinuclear antibody) 06/01/2022   Bilateral elbow joint pain 06/01/2022   Pain in right hip 06/01/2022   Arthralgia 04/02/2022   History of pancreatitis 11/20/2020   Migraine without status migrainosus, not intractable 07/19/2019   Right sided weakness 07/19/2019   Seborrheic dermatitis, unspecified 05/17/2018   Gender identity disorder 11/28/2017   Cough 09/14/2016   Hyperlipidemia 07/05/2016   Vitamin D deficiency 07/05/2016   Hyperglycemia 07/05/2016   Hyperhomocysteinemia ([place]) 07/05/2016   Hyperacusis 07/05/2016   Lumbar degenerative disc disease 06/25/2015   Myofascial pain syndrome 06/06/2015   ADD (attention deficit disorder) 09/24/2013   Anxiety and depression 07/12/2013   Auditory hallucination 07/12/2013   Allergic rhinitis 07/12/2013    Past  Surgical History:  Procedure Laterality Date   CHOLECYSTECTOMY N/A 11/21/2020   Procedure: LAPAROSCOPIC CHOLECYSTECTOMY WITH INTRAOPERATIVE CHOLANGIOGRAM;  Surgeon: Dwan Bolt, MD;  Location: [facility];  Service: General;  Laterality: N/A;   Nexplanon Implant     TYMPANOSTOMY TUBE PLACEMENT      OB History   No obstetric history on file.      Home Medications    Prior to Admission medications   Medication Sig Start Date End Date Taking? Authorizing Provider  ondansetron (ZOFRAN-ODT) 4 MG disintegrating tablet Take 1 tablet (4 mg total) by mouth every 8 (eight) hours as needed for nausea or vomiting. 11/15/22  Yes Melynda Ripple, NP  etonogestrel (NEXPLANON) 68 MG IMPL implant 1 each by Subdermal route once. 2016 placed    [provider]  Folic Acid-Vit Q000111Q [id number] (FOLBEE) 2.5-25-1 MG TABS tablet Take 1 tablet by mouth daily. Follow-up appt is due in must see provider for future refills Patient not taking: Reported on 06/21/2022 11/06/19   Binnie Rail, MD  hydroxychloroquine (PLAQUENIL) 200 MG tablet Take 1 tablet (200 mg total) by mouth daily. 06/21/22   Rice, Resa Miner, MD    Family History Family History  Adopted: Yes  Problem Relation Age of Onset   Alcohol abuse Father     Social History Social History   Tobacco Use   Smoking status: Never    Passive exposure: Never   Smokeless tobacco: Never  Vaping Use   Vaping Use: Every day   Substances:  Nicotine  Substance Use Topics   Alcohol use: Yes    Comment: socially   Drug use: Not Currently    Types: Marijuana     Allergies   Cymbalta [duloxetine hcl] and Dog epithelium (canis lupus familiaris)   Review of Systems Review of Systems  Gastrointestinal:  Positive for abdominal pain, diarrhea, nausea and vomiting.     Physical Exam Triage Vital Signs ED Triage Vitals  Enc Vitals Group     BP 11/15/22 0949 108/75     Pulse Rate 11/15/22 0949 85     Resp 11/15/22 0949 17     Temp 11/15/22  0949 97.8 F (36.6 C)     Temp Source 11/15/22 0949 Oral     SpO2 11/15/22 0949 96 %     Weight --      Height --      Head Circumference --      Peak Flow --      Pain Score 11/15/22 0948 5     Pain Loc --      Pain Edu? --      Excl. in [place]? --    No data found.  Updated Vital Signs BP 108/75 (BP Location: Right Arm)   Pulse 85   Temp 97.8 F (36.6 C) (Oral)   Resp 17   SpO2 96%   Visual Acuity Right Eye Distance:   Left Eye Distance:   Bilateral Distance:    Right Eye Near:   Left Eye Near:    Bilateral Near:     Physical Exam Vitals and nursing note reviewed.  Constitutional:      General: Julie E Shelnutt "Max" is not in acute distress.    Appearance: Normal appearance. Julie E Danek "Max" is not ill-appearing or diaphoretic.  HENT:     Head: Normocephalic and atraumatic.  Eyes:     Pupils: Pupils are equal, round, and reactive to light.  Cardiovascular:     Rate and Rhythm: Normal rate.  Pulmonary:     Effort: Pulmonary effort is normal.  Abdominal:     General: Bowel sounds are normal. There is no distension.     Palpations: Abdomen is soft. There is no hepatomegaly or splenomegaly.     Tenderness: There is generalized abdominal tenderness. Negative signs include Rovsing's sign and McBurney's sign.  Skin:    General: Skin is warm and dry.  Neurological:     General: No focal deficit present.     Mental Status: Julie Breach "Max" is alert and oriented to person, place, and time.  Psychiatric:        Mood and Affect: Mood normal.        Behavior: Behavior normal.      UC Treatments / Results  Labs (all labs ordered are listed, but only abnormal results are displayed) Labs Reviewed - No data to display  EKG   Radiology No results found.  Procedures Procedures (including critical care time)  Medications Ordered in UC Medications  ondansetron (ZOFRAN-ODT) disintegrating tablet 4 mg (has no administration in time range)    Initial Impression  / Assessment and Plan / UC Course  I have reviewed the triage vital signs and the nursing notes.  Pertinent labs & imaging results that were available during my care of the patient were reviewed by me and considered in my medical decision making (see chart for details).     Reviewed exam and symptoms with patient.  No red flags Discussed viral enteritis  and symptomatic treatment Patient given Zofran ODT in clinic and Rx sent to pharmacy Encouraged hydration with Gatorade, Powerade, Pedialyte, water Brat diet advance as tolerated Rest PCP follow-up if symptoms do not improve ER precautions reviewed and patient verbalized understanding Final Clinical Impressions(s) / UC Diagnoses   Final diagnoses:  Nausea and vomiting, unspecified vomiting type  Viral gastroenteritis     Discharge Instructions      Zofran as needed for nausea and vomiting Try to maintain your hydration with Gatorade, Powerade, Pedialyte, water Brat diet and advance as tolerated Rest Please follow-up with your PCP if your symptoms do not improve Please go to the emergency room if you develop any worsening symptoms   ED Prescriptions     Medication Sig Dispense Auth. Provider   ondansetron (ZOFRAN-ODT) 4 MG disintegrating tablet Take 1 tablet (4 mg total) by mouth every 8 (eight) hours as needed for nausea or vomiting. 20 tablet Melynda Ripple, NP      PDMP not reviewed this encounter.   Melynda Ripple, NP 11/15/22 1043

## 2022-11-17 ENCOUNTER — Ambulatory Visit: Payer: Commercial Managed Care - PPO | Admitting: Internal Medicine

## 2022-11-18 DIAGNOSIS — F329 Major depressive disorder, single episode, unspecified: Secondary | ICD-10-CM | POA: Diagnosis not present

## 2022-11-23 ENCOUNTER — Ambulatory Visit: Payer: Commercial Managed Care - PPO | Admitting: Internal Medicine

## 2022-11-23 NOTE — Progress Notes (Deleted)
Office Visit Note  Patient: Julie Anderson             Date of Birth: [birth year]           MRN: [id number]             PCP: Binnie Rail, MD Referring: Binnie Rail, MD Visit Date: 11/23/2022   Subjective:  No chief complaint on file.   History of Present Illness: Julie Anderson is a 27 y.o. adult here for follow up ***   Previous HPI 06/21/22 Julie Anderson is a 27 y.o. adult here for follow up for evaluation of positive ANA associated with joint and bone pains.  Lab test at initial visit did demonstrate persistent mild sedimentation rate elevation at 31 and mildly elevated complement C3 of 226.  Specific extractable nuclear antibody tests were negative.  Overall she has not experienced any significant improvement or worsening in symptoms.   Previous HPI 06/01/2022 Julie Anderson is a 27 y.o. adult here for evaluation of positive ANA associated with joint and bone pains. They have a history of hyperhomocysteinemia and some previous evaluation for joint and bone problems several years ago with some mild joint problems and myofascial pain thought to be main contributors. Overall reports joint pains and bone pains that have been ongoing for approximately 10 years duration.  Did have a fall sustained injury on the right elbow with diminished range of motion in that joint afterwards no other specific joint injuries and no joint surgeries.  He described pain that is pretty much constant but varying in intensity over time.  Seems to be worse under extreme stress or after overexertion.  Most often activity is limited by pain in upper extremities.  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  Wakes up with mild swelling in the hands some days can typically identify this due to wearing rings that will fit with varying level of tightness.  By the end of the day swelling is gone and symptoms are usually milder.  Less often has significant pains in the back right  hip and knees affecting mobility.  Right hip pain is exacerbated with certain movements or exercises such as hip flexion under resistance.  Does not take any specific pain or anti-inflammatory medications regularly and reports previous medicines tried are not very effective for their pain. They have a history of dandruff since years ago have seen dermatology for monitoring a mole to also address this but topical treatments were not very effective.  For over a year has a right ankle rash that is dry and itchy states constantly present, and more recently has facial rash below the right eye for a few months.   Labs reviewed 03/2022 ANA 1:80 speckled RF neg CCP neg ESR 47 CRP neg Vit D 13.22 CMP AST 42 ALT 68   No Rheumatology ROS completed.   PMFS History:  Patient Active Problem List   Diagnosis Date Noted   Bilateral hand pain 06/01/2022   Positive ANA (antinuclear antibody) 06/01/2022   Bilateral elbow joint pain 06/01/2022   Pain in right hip 06/01/2022   Arthralgia 04/02/2022   History of pancreatitis 11/20/2020   Migraine without status migrainosus, not intractable 07/19/2019   Right sided weakness 07/19/2019   Seborrheic dermatitis, unspecified 05/17/2018   Gender identity disorder 11/28/2017   Cough 09/14/2016   Hyperlipidemia 07/05/2016   Vitamin D deficiency 07/05/2016   Hyperglycemia 07/05/2016  Hyperhomocysteinemia ([place]) 07/05/2016   Hyperacusis 07/05/2016   Lumbar degenerative disc disease 06/25/2015   Myofascial pain syndrome 06/06/2015   ADD (attention deficit disorder) 09/24/2013   Anxiety and depression 07/12/2013   Auditory hallucination 07/12/2013   Allergic rhinitis 07/12/2013    Past Medical History:  Diagnosis Date   ADD (attention deficit disorder)    Anxiety    Bulging lumbar disc    Depression    Hyperhomocysteinemia ([place])    Misophonia     Family History  Adopted: Yes  Problem Relation Age of Onset   Alcohol abuse Father    Past Surgical  History:  Procedure Laterality Date   CHOLECYSTECTOMY N/A 11/21/2020   Procedure: LAPAROSCOPIC CHOLECYSTECTOMY WITH INTRAOPERATIVE CHOLANGIOGRAM;  Surgeon: Dwan Bolt, MD;  Location: [facility];  Service: General;  Laterality: N/A;   Nexplanon Implant     TYMPANOSTOMY TUBE PLACEMENT     Social History   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   Immunization History  Administered Date(s) Administered   DTaP 04/04/1997, 08/06/1997, 12/20/1997, 05/23/1998, 03/27/2002   HIB (PRP-OMP) 03/05/1997, 05/15/1997, 08/06/1997, 05/23/1998   HPV Quadrivalent 05/25/2000, 06/16/2011, 08/09/2012   Hepatitis A 02/23/2006, 07/08/2008   Hepatitis B 03/05/1997, 04/04/1997, 05/15/1997, 12/06/2007   IPV 03/05/1997, 05/15/1997, 08/06/1997, 03/27/2002   Influenza,inj,Quad PF,6+ Mos 05/31/2016, 05/30/2017, 05/17/2018   Influenza-Unspecified 06/11/2015   MMR 03/28/1998, 03/27/2002   Meningococcal Conjugate 05/25/2010   PFIZER(Purple Top)SARS-COV-2 Vaccination 11/23/2019, 12/14/2019, 08/16/2020   Tdap 12/06/2007   Varicella 03/28/1998, 06/16/2011     Objective: Vital Signs: There were no vitals taken for this visit.   Physical Exam   Musculoskeletal Exam: ***  CDAI Exam: CDAI Score: -- Patient Global: --; Provider Global: -- Swollen: --; Tender: -- Joint Exam 11/23/2022   No joint exam has been documented for this visit   There is currently no information documented on the homunculus. Go to the Rheumatology activity and complete the homunculus joint exam.  Investigation: No additional findings.  Imaging: No results found.  Recent Labs: Lab Results  Component Value Date   WBC 7.0 04/02/2022   HGB 13.5 04/02/2022   PLT 313.0 04/02/2022   NA 138 04/02/2022   K 3.9 04/02/2022   CL 105 04/02/2022   CO2 25 04/02/2022   GLUCOSE 96 04/02/2022   BUN 16 04/02/2022   CREATININE 1.04 04/02/2022   BILITOT 0.5 04/02/2022   ALKPHOS 77 04/02/2022   AST 42 (H)  04/02/2022   ALT 68 (H) 04/02/2022   PROT 8.2 04/02/2022   ALBUMIN 4.8 04/02/2022   CALCIUM 10.0 04/02/2022   GFRAA 142 09/07/2020    Speciality Comments: No specialty comments available.  Procedures:  No procedures performed Allergies: Cymbalta [duloxetine hcl] and Dog epithelium (canis lupus familiaris)   Assessment / Plan:     Visit Diagnoses: No diagnosis found.  ***  Orders: No orders of the defined types were placed in this encounter.  No orders of the defined types were placed in this encounter.    Follow-Up Instructions: No follow-ups on file.   Collier Salina, MD  Note - This record has been created using [employer].  Chart creation errors have been sought, but may not always  have been located. Such creation errors do not reflect on  the standard of medical care.

## 2022-11-24 DIAGNOSIS — N9411 Superficial (introital) dyspareunia: Secondary | ICD-10-CM | POA: Diagnosis not present

## 2022-11-24 DIAGNOSIS — M62838 Other muscle spasm: Secondary | ICD-10-CM | POA: Diagnosis not present

## 2022-11-24 DIAGNOSIS — M6281 Muscle weakness (generalized): Secondary | ICD-10-CM | POA: Diagnosis not present

## 2022-11-24 DIAGNOSIS — M6289 Other specified disorders of muscle: Secondary | ICD-10-CM | POA: Diagnosis not present

## 2022-11-24 DIAGNOSIS — R102 Pelvic and perineal pain: Secondary | ICD-10-CM | POA: Diagnosis not present

## 2022-12-01 ENCOUNTER — Ambulatory Visit: Payer: Commercial Managed Care - PPO | Attending: Internal Medicine | Admitting: Internal Medicine

## 2022-12-01 ENCOUNTER — Encounter: Payer: Self-pay | Admitting: Internal Medicine

## 2022-12-01 VITALS — BP 101/68 | HR 86 | Resp 12 | Ht 59.75 in | Wt 154.0 lb

## 2022-12-01 DIAGNOSIS — M7918 Myalgia, other site: Secondary | ICD-10-CM | POA: Diagnosis not present

## 2022-12-01 DIAGNOSIS — R768 Other specified abnormal immunological findings in serum: Secondary | ICD-10-CM | POA: Diagnosis not present

## 2022-12-01 DIAGNOSIS — M25551 Pain in right hip: Secondary | ICD-10-CM

## 2022-12-01 NOTE — Progress Notes (Signed)
Office Visit Note  Patient: Julie Anderson             Date of Birth: 11-17-1995           MRN: [id number]             PCP: Binnie Rail, MD Referring: Binnie Rail, MD Visit Date: 12/01/2022   Subjective:  Follow-up   History of Present Illness: Julie Anderson is a 27 y.o. adult here for follow up for chronic joint and body pains with elevated sedimentation rate and positive ANA.  They tried hydroxychloroquine as planned but discontinued the medication after 2-3 months due to lack of symptom improvement. Has ongoing back and hip pain intermittently is bad enough to use a walker for support as this decreases pain while walking. Often wakes with back pain in the morning that improves after several hours. Not frequently awakening from symptoms. Also gets widespread pain and soreness diffusely regardless of timing.  Previous HPI 06/21/22 Julie Anderson is a 27 y.o. adult here for follow up for evaluation of positive ANA associated with joint and bone pains.  Lab test at initial visit did demonstrate persistent mild sedimentation rate elevation at 31 and mildly elevated complement C3 of 226.  Specific extractable nuclear antibody tests were negative.  Overall she has not experienced any significant improvement or worsening in symptoms.   Previous HPI 06/01/2022 Julie Anderson is a 27 y.o. adult here for evaluation of positive ANA associated with joint and bone pains. They have a history of hyperhomocysteinemia and some previous evaluation for joint and bone problems several years ago with some mild joint problems and myofascial pain thought to be main contributors. Overall reports joint pains and bone pains that have been ongoing for approximately 10 years duration.  Did have a fall sustained injury on the right elbow with diminished range of motion in that joint afterwards no other specific joint injuries and no joint surgeries.  He described pain that is pretty much constant but varying in intensity  over time.  Seems to be worse under extreme stress or after overexertion.  Most often activity is limited by pain in upper extremities.  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  Wakes up with mild swelling in the hands some days can typically identify this due to wearing rings that will fit with varying level of tightness.  By the end of the day swelling is gone and symptoms are usually milder.  Less often has significant pains in the back right hip and knees affecting mobility.  Right hip pain is exacerbated with certain movements or exercises such as hip flexion under resistance.  Does not take any specific pain or anti-inflammatory medications regularly and reports previous medicines tried are not very effective for their pain. They have a history of dandruff since years ago have seen dermatology for monitoring a mole to also address this but topical treatments were not very effective.  For over a year has a right ankle rash that is dry and itchy states constantly present, and more recently has facial rash below the right eye for a few months.   Labs reviewed 03/2022 ANA 1:80 speckled RF neg CCP neg ESR 47 CRP neg Vit D 13.22 CMP AST 42 ALT 68   Review of Systems  Constitutional:  Negative for fatigue.  HENT:  Negative for mouth sores and mouth dryness.   Eyes:  Negative  for dryness.  Respiratory:  Positive for shortness of breath.   Cardiovascular:  Negative for chest pain and palpitations.  Gastrointestinal:  Positive for diarrhea. Negative for blood in stool and constipation.  Endocrine: Negative for increased urination.  Genitourinary:  Negative for involuntary urination.  Musculoskeletal:  Positive for joint pain, joint pain, joint swelling, myalgias, morning stiffness and myalgias. Negative for gait problem, muscle weakness and muscle tenderness.  Skin:  Negative for color change, rash, hair loss and sensitivity to sunlight.   Allergic/Immunologic: Negative for susceptible to infections.  Neurological:  Positive for dizziness. Negative for headaches.  Hematological:  Negative for swollen glands.  Psychiatric/Behavioral:  Positive for depressed mood. Negative for sleep disturbance. The patient is nervous/anxious.     PMFS History:  Patient Active Problem List   Diagnosis Date Noted   Bilateral hand pain 06/01/2022   Positive ANA (antinuclear antibody) 06/01/2022   Bilateral elbow joint pain 06/01/2022   Pain in right hip 06/01/2022   Arthralgia 04/02/2022   History of pancreatitis 11/20/2020   Migraine without status migrainosus, not intractable 07/19/2019   Right sided weakness 07/19/2019   Seborrheic dermatitis, unspecified 05/17/2018   Gender identity disorder 11/28/2017   Cough 09/14/2016   Hyperlipidemia 07/05/2016   Vitamin D deficiency 07/05/2016   Hyperglycemia 07/05/2016   Hyperhomocysteinemia 07/05/2016   Hyperacusis 07/05/2016   Lumbar degenerative disc disease 06/25/2015   Myofascial pain syndrome 06/06/2015   ADD (attention deficit disorder) 09/24/2013   Anxiety and depression 07/12/2013   Auditory hallucination 07/12/2013   Allergic rhinitis 07/12/2013    Past Medical History:  Diagnosis Date   ADD (attention deficit disorder)    Anxiety    Bulging lumbar disc    Depression    Hyperhomocysteinemia    Misophonia     Family History  Adopted: Yes  Problem Relation Age of Onset   Alcohol abuse Father    Past Surgical History:  Procedure Laterality Date   CHOLECYSTECTOMY N/A 11/21/2020   Procedure: LAPAROSCOPIC CHOLECYSTECTOMY WITH INTRAOPERATIVE CHOLANGIOGRAM;  Surgeon: Dwan Bolt, MD;  Location: [facility];  Service: General;  Laterality: N/A;   Nexplanon Implant     TYMPANOSTOMY TUBE PLACEMENT     Social History   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   Immunization History  Administered Date(s) Administered   DTaP 04/04/1997, 08/06/1997,  12/20/1997, 05/23/1998, 03/27/2002   HIB (PRP-OMP) 03/05/1997, 05/15/1997, 08/06/1997, 05/23/1998   HPV Quadrivalent 05/25/2000, 06/16/2011, 08/09/2012   Hepatitis A 02/23/2006, 07/08/2008   Hepatitis B 03/05/1997, 04/04/1997, 05/15/1997, 12/06/2007   IPV 03/05/1997, 05/15/1997, 08/06/1997, 03/27/2002   Influenza,inj,Quad PF,6+ Mos 05/31/2016, 05/30/2017, 05/17/2018   Influenza-Unspecified 06/11/2015   MMR 03/28/1998, 03/27/2002   Meningococcal Conjugate 05/25/2010   PFIZER(Purple Top)SARS-COV-2 Vaccination 11/23/2019, 12/14/2019, 08/16/2020   Tdap 12/06/2007   Varicella 03/28/1998, 06/16/2011     Objective: Vital Signs: BP 101/68 (BP Location: Right Arm, Patient Position: Sitting, Cuff Size: Normal)   Pulse 86   Resp 12   Ht 4' 11.75" (1.518 m)   Wt 154 lb (69.9 kg)   BMI 30.33 kg/m    Physical Exam Cardiovascular:     Rate and Rhythm: Normal rate and regular rhythm.  Pulmonary:     Effort: Pulmonary effort is normal.     Breath sounds: Normal breath sounds.  Musculoskeletal:     Right lower leg: No edema.     Left lower leg: No edema.  Skin:    General: Skin is warm and  dry.     Findings: No rash.  Neurological:     Mental Status: Antonietta Breach "Max" is alert.  Psychiatric:        Mood and Affect: Mood normal.      Musculoskeletal Exam:  Shoulders full ROM no tenderness or swelling Elbows full ROM no tenderness or swelling Wrists full ROM no tenderness or swelling Fingers full ROM no tenderness or swelling Tenderness to pressure on very low back worst over SI joints Knees full ROM no tenderness or swelling   Investigation: No additional findings.  Imaging: No results found.  Recent Labs: Lab Results  Component Value Date   WBC 7.0 04/02/2022   HGB 13.5 04/02/2022   PLT 313.0 04/02/2022   NA 138 04/02/2022   K 3.9 04/02/2022   CL 105 04/02/2022   CO2 25 04/02/2022   GLUCOSE 96 04/02/2022   BUN 16 04/02/2022   CREATININE 1.04 04/02/2022   BILITOT  0.5 04/02/2022   ALKPHOS 77 04/02/2022   AST 42 (H) 04/02/2022   ALT 68 (H) 04/02/2022   PROT 8.2 04/02/2022   ALBUMIN 4.8 04/02/2022   CALCIUM 10.0 04/02/2022   GFRAA 142 09/07/2020    Speciality Comments: No specialty comments available.  Procedures:  No procedures performed Allergies: Bee pollen, Cymbalta [duloxetine hcl], and Dog epithelium (canis lupus familiaris)   Assessment / Plan:     Visit Diagnoses: Pain in right hip - Plan: Sedimentation rate, C-reactive protein  Ongoing back at low back and hip not entirely typical for inflammatory low back pain but very unusual to be from degenerative arthritis at young age with no injury or surgical history. Possibly indicative for spondyloarthritis associated with elevated inflammatory markers. Not interested in joint or back injections. Will recheck these today. If persistent high recommend MRI of the pelvis to rule out. If labs normalized or imaging negative recommend more symptomatic management.  Myofascial pain syndrome  Widespread pain not localized to joints. Previous intolerance to Cymbalta but may benefit with trial of alternate neuropathic medication treatment.   Orders: Orders Placed This Encounter  Procedures   Sedimentation rate   C-reactive protein   No orders of the defined types were placed in this encounter.    Follow-Up Instructions: No follow-ups on file.   Collier Salina, MD  Note - This record has been created using [employer].  Chart creation errors have been sought, but may not always  have been located. Such creation errors do not reflect on  the standard of medical care.

## 2022-12-02 DIAGNOSIS — F329 Major depressive disorder, single episode, unspecified: Secondary | ICD-10-CM | POA: Diagnosis not present

## 2022-12-02 LAB — SEDIMENTATION RATE: Sed Rate: 34 mm/h — ABNORMAL HIGH (ref 0–20)

## 2022-12-02 LAB — C-REACTIVE PROTEIN: CRP: 2.9 mg/L (ref <8.0)

## 2022-12-03 ENCOUNTER — Other Ambulatory Visit (HOSPITAL_COMMUNITY): Payer: Self-pay

## 2022-12-03 ENCOUNTER — Other Ambulatory Visit: Payer: Self-pay | Admitting: Internal Medicine

## 2022-12-03 MED ORDER — FOLBEE 2.5-25-1 MG PO TABS
1.0000 | ORAL_TABLET | Freq: Every day | ORAL | 0 refills | Status: AC
  Filled 2022-12-03: qty 90, 90d supply, fill #0

## 2022-12-04 ENCOUNTER — Other Ambulatory Visit (HOSPITAL_COMMUNITY): Payer: Self-pay

## 2022-12-06 ENCOUNTER — Other Ambulatory Visit (HOSPITAL_COMMUNITY): Payer: Self-pay

## 2022-12-07 ENCOUNTER — Other Ambulatory Visit (HOSPITAL_COMMUNITY): Payer: Self-pay

## 2022-12-08 ENCOUNTER — Other Ambulatory Visit (HOSPITAL_COMMUNITY): Payer: Self-pay

## 2022-12-09 DIAGNOSIS — F329 Major depressive disorder, single episode, unspecified: Secondary | ICD-10-CM | POA: Diagnosis not present

## 2022-12-16 DIAGNOSIS — F329 Major depressive disorder, single episode, unspecified: Secondary | ICD-10-CM | POA: Diagnosis not present

## 2022-12-23 DIAGNOSIS — F329 Major depressive disorder, single episode, unspecified: Secondary | ICD-10-CM | POA: Diagnosis not present

## 2022-12-30 DIAGNOSIS — F329 Major depressive disorder, single episode, unspecified: Secondary | ICD-10-CM | POA: Diagnosis not present

## 2023-01-02 ENCOUNTER — Encounter: Payer: Self-pay | Admitting: Internal Medicine

## 2023-01-03 ENCOUNTER — Other Ambulatory Visit (HOSPITAL_COMMUNITY): Payer: Self-pay

## 2023-01-03 MED ORDER — ALBUTEROL SULFATE HFA 108 (90 BASE) MCG/ACT IN AERS
2.0000 | INHALATION_SPRAY | Freq: Four times a day (QID) | RESPIRATORY_TRACT | 2 refills | Status: AC | PRN
  Filled 2023-01-03: qty 6.7, 20d supply, fill #0
  Filled 2023-10-03: qty 6.7, 20d supply, fill #1

## 2023-01-06 DIAGNOSIS — F329 Major depressive disorder, single episode, unspecified: Secondary | ICD-10-CM | POA: Diagnosis not present

## 2023-01-13 DIAGNOSIS — F329 Major depressive disorder, single episode, unspecified: Secondary | ICD-10-CM | POA: Diagnosis not present

## 2023-01-20 DIAGNOSIS — F329 Major depressive disorder, single episode, unspecified: Secondary | ICD-10-CM | POA: Diagnosis not present

## 2023-01-27 DIAGNOSIS — F329 Major depressive disorder, single episode, unspecified: Secondary | ICD-10-CM | POA: Diagnosis not present

## 2023-02-03 DIAGNOSIS — F329 Major depressive disorder, single episode, unspecified: Secondary | ICD-10-CM | POA: Diagnosis not present

## 2023-02-22 ENCOUNTER — Encounter: Payer: Self-pay | Admitting: Internal Medicine

## 2023-02-22 NOTE — Progress Notes (Signed)
Subjective:    Patient ID: [name], adult    DOB: [birth year], 27 y.o.   MRN: [id number]      HPI Julie Anderson is here for a Physical exam and her chronic medical problems.   Here today for physical.  She is here with a friend.   Medications and allergies reviewed with patient and updated if appropriate.  Current Outpatient Medications on File Prior to Visit  Medication Sig Dispense Refill   albuterol (VENTOLIN HFA) 108 (90 Base) MCG/ACT inhaler Inhale 2 puffs into the lungs every 6 (six) hours as needed for wheezing or shortness of breath. 6.7 g 2   etonogestrel (NEXPLANON) 68 MG IMPL implant 1 each by Subdermal route once. 2016 placed     Folic Acid-Vit B6-Vit B12 (FOLBEE) 2.5-25-1 MG TABS tablet Take 1 tablet by mouth daily. Follow-up appt is due in must see provider for future refills 90 tablet 0   No current facility-administered medications on file prior to visit.    Review of Systems  Constitutional:  Negative for fever.  Eyes:  Negative for visual disturbance.  Respiratory:  Positive for cough (with changes in temperature - asthma) and wheezing (if coughs a lot). Negative for shortness of breath.   Cardiovascular:  Negative for chest pain, palpitations and leg swelling.  Gastrointestinal:  Positive for diarrhea (since GB removed - does not matter what she eats). Negative for abdominal pain (Later states she does have some abdominal tenderness or discomfort when people push on her stomach for her a period of time, but outside of this), blood in stool and constipation.       No gerd  Genitourinary:  Negative for dysuria.  Musculoskeletal:  Positive for arthralgias and back pain.  Skin:  Positive for rash (keratosis pilaris).  Neurological:  Negative for light-headedness and headaches.  Psychiatric/Behavioral:  Negative for dysphoric mood. The patient is not nervous/anxious.        Objective:   Vitals:   02/23/23 1529  BP: 128/80  Pulse: 98  Temp: 98.3 F (36.8 C)   SpO2: 97%   Filed Weights   02/23/23 1529  Weight: 155 lb (70.3 kg)   Body mass index is 31.31 kg/m.  BP Readings from Last 3 Encounters:  02/23/23 128/80  12/01/22 101/68  11/15/22 108/75    Wt Readings from Last 3 Encounters:  02/23/23 155 lb (70.3 kg)  12/01/22 154 lb (69.9 kg)  06/21/22 159 lb 9.6 oz (72.4 kg)       Physical Exam Constitutional: She appears well-developed and well-nourished. No distress.  HENT:  Head: Normocephalic and atraumatic.  Right Ear: External ear normal. Normal ear canal and TM Left Ear: External ear normal.  Normal ear canal and TM Mouth/Throat: Oropharynx is clear and moist.  Eyes: Conjunctivae normal.  Neck: Neck supple. No tracheal deviation present. No thyromegaly present.  No carotid bruit  Cardiovascular: Normal rate, regular rhythm and normal heart sounds.   No murmur heard.  No edema. Pulmonary/Chest: Effort normal and breath sounds normal. No respiratory distress. She has no wheezes. She has no rales.  Breast: deferred   Abdominal: Soft. She exhibits no distension. There is no tenderness.  Lymphadenopathy: She has no cervical adenopathy.  Skin: Skin is warm and dry. She is not diaphoretic.  Psychiatric: She has a normal mood and affect. Her behavior is normal.     Lab Results  Component Value Date   WBC 7.0 04/02/2022   HGB 13.5 04/02/2022   HCT  40.5 04/02/2022   PLT 313.0 04/02/2022   GLUCOSE 96 04/02/2022   CHOL 322 (H) 04/02/2022   TRIG 314.0 (H) 04/02/2022   HDL 48.90 04/02/2022   LDLDIRECT 233.0 04/02/2022   LDLCALC 217 (H) 11/28/2017   ALT 68 (H) 04/02/2022   AST 42 (H) 04/02/2022   NA 138 04/02/2022   K 3.9 04/02/2022   CL 105 04/02/2022   CREATININE 1.04 04/02/2022   BUN 16 04/02/2022   CO2 25 04/02/2022   TSH 1.42 04/02/2022   INR 1.0 07/15/2019   HGBA1C 5.8 04/02/2022         Assessment & Plan:   Physical exam: Screening blood work deferred by patient Exercise  irregular-encouraged regular  exercise Weight  obese Substance abuse  none   Reviewed recommended immunizations.   Health Maintenance  Topic Date Due   DTaP/Tdap/Td (7 - Td or Tdap) 12/05/2017   PAP-Cervical Cytology Screening  Never done   PAP SMEAR-Modifier  Never done   COVID-19 Vaccine (5 - 2023-24 season) 03/11/2023 (Originally 04/30/2022)   INFLUENZA VACCINE  03/31/2023   HPV VACCINES  Completed   Hepatitis C Screening  Completed   HIV Screening  Completed     She was wondering who would do testosterone injections.-Advised to check with [facility] endocrine.  She does have a consult regarding having hysterectomy     See Problem List for Assessment and Plan of chronic medical problems.

## 2023-02-22 NOTE — Patient Instructions (Addendum)
Medications changes include :   none      Return in about 1 year (around 02/23/2024) for Physical Exam.   Health Maintenance, Female Adopting a healthy lifestyle and getting preventive care are important in promoting health and wellness. Ask your health care provider about: The right schedule for you to have regular tests and exams. Things you can do on your own to prevent diseases and keep yourself healthy. What should I know about diet, weight, and exercise? Eat a healthy diet  Eat a diet that includes plenty of vegetables, fruits, low-fat dairy products, and lean protein. Do not eat a lot of foods that are high in solid fats, added sugars, or sodium. Maintain a healthy weight Body mass index (BMI) is used to identify weight problems. It estimates body fat based on height and weight. Your health care provider can help determine your BMI and help you achieve or maintain a healthy weight. Get regular exercise Get regular exercise. This is one of the most important things you can do for your health. Most adults should: Exercise for at least 150 minutes each week. The exercise should increase your heart rate and make you sweat (moderate-intensity exercise). Do strengthening exercises at least twice a week. This is in addition to the moderate-intensity exercise. Spend less time sitting. Even light physical activity can be beneficial. Watch cholesterol and blood lipids Have your blood tested for lipids and cholesterol at 26 years of age, then have this test every 5 years. Have your cholesterol levels checked more often if: Your lipid or cholesterol levels are high. You are older than 26 years of age. You are at high risk for heart disease. What should I know about cancer screening? Depending on your health history and family history, you may need to have cancer screening at various ages. This may include screening for: Breast cancer. Cervical cancer. Colorectal cancer. Skin  cancer. Lung cancer. What should I know about heart disease, diabetes, and high blood pressure? Blood pressure and heart disease High blood pressure causes heart disease and increases the risk of stroke. This is more likely to develop in people who have high blood pressure readings or are overweight. Have your blood pressure checked: Every 3-5 years if you are 71-23 years of age. Every year if you are 69 years old or older. Diabetes Have regular diabetes screenings. This checks your fasting blood sugar level. Have the screening done: Once every three years after age 35 if you are at a normal weight and have a low risk for diabetes. More often and at a younger age if you are overweight or have a high risk for diabetes. What should I know about preventing infection? Hepatitis B If you have a higher risk for hepatitis B, you should be screened for this virus. Talk with your health care provider to find out if you are at risk for hepatitis B infection. Hepatitis C Testing is recommended for: Everyone born from [birth year] through 1964. Anyone with known risk factors for hepatitis C. Sexually transmitted infections (STIs) Get screened for STIs, including gonorrhea and chlamydia, if: You are sexually active and are younger than 26 years of age. You are older than 26 years of age and your health care provider tells you that you are at risk for this type of infection. Your sexual activity has changed since you were last screened, and you are at increased risk for chlamydia or gonorrhea. Ask your health care provider if you are at  risk. Ask your health care provider about whether you are at high risk for HIV. Your health care provider may recommend a prescription medicine to help prevent HIV infection. If you choose to take medicine to prevent HIV, you should first get tested for HIV. You should then be tested every 3 months for as long as you are taking the medicine. Pregnancy If you are about to stop  having your period (premenopausal) and you may become pregnant, seek counseling before you get pregnant. Take 400 to 800 micrograms (mcg) of folic acid every day if you become pregnant. Ask for birth control (contraception) if you want to prevent pregnancy. Osteoporosis and menopause Osteoporosis is a disease in which the bones lose minerals and strength with aging. This can result in bone fractures. If you are 51 years old or older, or if you are at risk for osteoporosis and fractures, ask your health care provider if you should: Be screened for bone loss. Take a calcium or vitamin D supplement to lower your risk of fractures. Be given hormone replacement therapy (HRT) to treat symptoms of menopause. Follow these instructions at home: Alcohol use Do not drink alcohol if: Your health care provider tells you not to drink. You are pregnant, may be pregnant, or are planning to become pregnant. If you drink alcohol: Limit how much you have to: 0-1 drink a day. Know how much alcohol is in your drink. In the [place] one drink equals one 12 oz bottle of beer (355 mL), one 5 oz glass of wine (148 mL), or one 1 oz glass of hard liquor (44 mL). Lifestyle Do not use any products that contain nicotine or tobacco. These products include cigarettes, chewing tobacco, and vaping devices, such as e-cigarettes. If you need help quitting, ask your health care provider. Do not use street drugs. Do not share needles. Ask your health care provider for help if you need support or information about quitting drugs. General instructions Schedule regular health, dental, and eye exams. Stay current with your vaccines. Tell your health care provider if: You often feel depressed. You have ever been abused or do not feel safe at home. Summary Adopting a healthy lifestyle and getting preventive care are important in promoting health and wellness. Follow your health care provider's instructions about healthy diet,  exercising, and getting tested or screened for diseases. Follow your health care provider's instructions on monitoring your cholesterol and blood pressure. This information is not intended to replace advice given to you by your health care provider. Make sure you discuss any questions you have with your health care provider. Document Revised: 01/05/2021 Document Reviewed: 01/05/2021 Elsevier Patient Education  2024 [employer].

## 2023-02-23 ENCOUNTER — Ambulatory Visit (INDEPENDENT_AMBULATORY_CARE_PROVIDER_SITE_OTHER): Payer: 59 | Admitting: Internal Medicine

## 2023-02-23 VITALS — BP 128/80 | HR 98 | Temp 98.3°F | Ht 59.0 in | Wt 155.0 lb

## 2023-02-23 DIAGNOSIS — F32A Depression, unspecified: Secondary | ICD-10-CM | POA: Diagnosis not present

## 2023-02-23 DIAGNOSIS — L858 Other specified epidermal thickening: Secondary | ICD-10-CM | POA: Insufficient documentation

## 2023-02-23 DIAGNOSIS — E7849 Other hyperlipidemia: Secondary | ICD-10-CM

## 2023-02-23 DIAGNOSIS — R739 Hyperglycemia, unspecified: Secondary | ICD-10-CM

## 2023-02-23 DIAGNOSIS — Z0000 Encounter for general adult medical examination without abnormal findings: Secondary | ICD-10-CM

## 2023-02-23 DIAGNOSIS — E559 Vitamin D deficiency, unspecified: Secondary | ICD-10-CM | POA: Diagnosis not present

## 2023-02-23 DIAGNOSIS — F419 Anxiety disorder, unspecified: Secondary | ICD-10-CM

## 2023-02-23 NOTE — Assessment & Plan Note (Signed)
Chronic Blood work deferred by patient

## 2023-02-23 NOTE — Assessment & Plan Note (Signed)
Chronic Blood work deferred by patient Lab Results  Component Value Date   HGBA1C 5.8 04/02/2022   Low sugar / carb diet Stressed regular exercise

## 2023-02-23 NOTE — Assessment & Plan Note (Signed)
Chronic Regular exercise and healthy diet encouraged Blood work deferred by patient Lifestyle controlled

## 2023-02-23 NOTE — Assessment & Plan Note (Signed)
Chronic Controlled with therapy

## 2023-02-24 ENCOUNTER — Encounter: Payer: Self-pay | Admitting: Radiology

## 2023-02-24 DIAGNOSIS — F329 Major depressive disorder, single episode, unspecified: Secondary | ICD-10-CM | POA: Diagnosis not present

## 2023-03-10 DIAGNOSIS — F329 Major depressive disorder, single episode, unspecified: Secondary | ICD-10-CM | POA: Diagnosis not present

## 2023-03-17 DIAGNOSIS — F329 Major depressive disorder, single episode, unspecified: Secondary | ICD-10-CM | POA: Diagnosis not present

## 2023-03-31 DIAGNOSIS — F329 Major depressive disorder, single episode, unspecified: Secondary | ICD-10-CM | POA: Diagnosis not present

## 2023-04-07 DIAGNOSIS — F329 Major depressive disorder, single episode, unspecified: Secondary | ICD-10-CM | POA: Diagnosis not present

## 2023-04-11 ENCOUNTER — Encounter: Payer: Self-pay | Admitting: Internal Medicine

## 2023-04-11 DIAGNOSIS — F649 Gender identity disorder, unspecified: Secondary | ICD-10-CM

## 2023-04-13 DIAGNOSIS — H52203 Unspecified astigmatism, bilateral: Secondary | ICD-10-CM | POA: Diagnosis not present

## 2023-04-14 DIAGNOSIS — F329 Major depressive disorder, single episode, unspecified: Secondary | ICD-10-CM | POA: Diagnosis not present

## 2023-04-15 DIAGNOSIS — F649 Gender identity disorder, unspecified: Secondary | ICD-10-CM | POA: Diagnosis not present

## 2023-04-21 DIAGNOSIS — F329 Major depressive disorder, single episode, unspecified: Secondary | ICD-10-CM | POA: Diagnosis not present

## 2023-04-28 DIAGNOSIS — F329 Major depressive disorder, single episode, unspecified: Secondary | ICD-10-CM | POA: Diagnosis not present

## 2023-05-05 DIAGNOSIS — F329 Major depressive disorder, single episode, unspecified: Secondary | ICD-10-CM | POA: Diagnosis not present

## 2023-06-02 DIAGNOSIS — F329 Major depressive disorder, single episode, unspecified: Secondary | ICD-10-CM | POA: Diagnosis not present

## 2023-06-16 DIAGNOSIS — F329 Major depressive disorder, single episode, unspecified: Secondary | ICD-10-CM | POA: Diagnosis not present

## 2023-06-21 DIAGNOSIS — Z01818 Encounter for other preprocedural examination: Secondary | ICD-10-CM | POA: Diagnosis not present

## 2023-06-30 DIAGNOSIS — F329 Major depressive disorder, single episode, unspecified: Secondary | ICD-10-CM | POA: Diagnosis not present

## 2023-07-05 DIAGNOSIS — F640 Transsexualism: Secondary | ICD-10-CM | POA: Diagnosis not present

## 2023-07-05 DIAGNOSIS — N8301 Follicular cyst of right ovary: Secondary | ICD-10-CM | POA: Diagnosis not present

## 2023-07-05 DIAGNOSIS — F17290 Nicotine dependence, other tobacco product, uncomplicated: Secondary | ICD-10-CM | POA: Diagnosis not present

## 2023-07-05 DIAGNOSIS — Z6833 Body mass index (BMI) 33.0-33.9, adult: Secondary | ICD-10-CM | POA: Diagnosis not present

## 2023-07-05 DIAGNOSIS — N854 Malposition of uterus: Secondary | ICD-10-CM | POA: Diagnosis not present

## 2023-07-05 DIAGNOSIS — N8300 Follicular cyst of ovary, unspecified side: Secondary | ICD-10-CM | POA: Diagnosis not present

## 2023-07-05 DIAGNOSIS — N8302 Follicular cyst of left ovary: Secondary | ICD-10-CM | POA: Diagnosis not present

## 2023-07-05 DIAGNOSIS — Z79899 Other long term (current) drug therapy: Secondary | ICD-10-CM | POA: Diagnosis not present

## 2023-07-05 DIAGNOSIS — J45909 Unspecified asthma, uncomplicated: Secondary | ICD-10-CM | POA: Diagnosis not present

## 2023-07-05 DIAGNOSIS — E785 Hyperlipidemia, unspecified: Secondary | ICD-10-CM | POA: Diagnosis not present

## 2023-07-05 DIAGNOSIS — Z3046 Encounter for surveillance of implantable subdermal contraceptive: Secondary | ICD-10-CM | POA: Diagnosis not present

## 2023-07-05 DIAGNOSIS — N736 Female pelvic peritoneal adhesions (postinfective): Secondary | ICD-10-CM | POA: Diagnosis not present

## 2023-07-05 DIAGNOSIS — E66812 Obesity, class 2: Secondary | ICD-10-CM | POA: Diagnosis not present

## 2023-07-05 DIAGNOSIS — F649 Gender identity disorder, unspecified: Secondary | ICD-10-CM | POA: Diagnosis not present

## 2023-07-14 DIAGNOSIS — F329 Major depressive disorder, single episode, unspecified: Secondary | ICD-10-CM | POA: Diagnosis not present

## 2023-07-21 DIAGNOSIS — F329 Major depressive disorder, single episode, unspecified: Secondary | ICD-10-CM | POA: Diagnosis not present

## 2023-08-04 DIAGNOSIS — F329 Major depressive disorder, single episode, unspecified: Secondary | ICD-10-CM | POA: Diagnosis not present

## 2023-08-11 DIAGNOSIS — F329 Major depressive disorder, single episode, unspecified: Secondary | ICD-10-CM | POA: Diagnosis not present

## 2023-08-18 DIAGNOSIS — F329 Major depressive disorder, single episode, unspecified: Secondary | ICD-10-CM | POA: Diagnosis not present

## 2023-09-01 DIAGNOSIS — F329 Major depressive disorder, single episode, unspecified: Secondary | ICD-10-CM | POA: Diagnosis not present

## 2023-09-09 DIAGNOSIS — F329 Major depressive disorder, single episode, unspecified: Secondary | ICD-10-CM | POA: Diagnosis not present

## 2023-09-14 DIAGNOSIS — F329 Major depressive disorder, single episode, unspecified: Secondary | ICD-10-CM | POA: Diagnosis not present

## 2023-09-23 DIAGNOSIS — F329 Major depressive disorder, single episode, unspecified: Secondary | ICD-10-CM | POA: Diagnosis not present

## 2023-09-28 DIAGNOSIS — Z23 Encounter for immunization: Secondary | ICD-10-CM | POA: Diagnosis not present

## 2023-09-28 DIAGNOSIS — F649 Gender identity disorder, unspecified: Secondary | ICD-10-CM | POA: Diagnosis not present

## 2023-09-30 DIAGNOSIS — F329 Major depressive disorder, single episode, unspecified: Secondary | ICD-10-CM | POA: Diagnosis not present

## 2023-10-03 ENCOUNTER — Other Ambulatory Visit (HOSPITAL_COMMUNITY): Payer: Self-pay

## 2023-10-05 ENCOUNTER — Other Ambulatory Visit (HOSPITAL_COMMUNITY): Payer: Self-pay

## 2023-10-07 DIAGNOSIS — F329 Major depressive disorder, single episode, unspecified: Secondary | ICD-10-CM | POA: Diagnosis not present

## 2023-10-14 DIAGNOSIS — F329 Major depressive disorder, single episode, unspecified: Secondary | ICD-10-CM | POA: Diagnosis not present

## 2023-10-21 DIAGNOSIS — F329 Major depressive disorder, single episode, unspecified: Secondary | ICD-10-CM | POA: Diagnosis not present

## 2023-10-28 DIAGNOSIS — F329 Major depressive disorder, single episode, unspecified: Secondary | ICD-10-CM | POA: Diagnosis not present

## 2023-11-04 DIAGNOSIS — F329 Major depressive disorder, single episode, unspecified: Secondary | ICD-10-CM | POA: Diagnosis not present

## 2023-11-11 DIAGNOSIS — F329 Major depressive disorder, single episode, unspecified: Secondary | ICD-10-CM | POA: Diagnosis not present

## 2023-11-25 DIAGNOSIS — F329 Major depressive disorder, single episode, unspecified: Secondary | ICD-10-CM | POA: Diagnosis not present

## 2023-12-05 ENCOUNTER — Ambulatory Visit: Admitting: Internal Medicine

## 2023-12-09 DIAGNOSIS — F329 Major depressive disorder, single episode, unspecified: Secondary | ICD-10-CM | POA: Diagnosis not present

## 2023-12-14 DIAGNOSIS — F329 Major depressive disorder, single episode, unspecified: Secondary | ICD-10-CM | POA: Diagnosis not present

## 2023-12-28 DIAGNOSIS — F329 Major depressive disorder, single episode, unspecified: Secondary | ICD-10-CM | POA: Diagnosis not present

## 2023-12-28 DIAGNOSIS — F649 Gender identity disorder, unspecified: Secondary | ICD-10-CM | POA: Diagnosis not present

## 2024-01-04 DIAGNOSIS — F329 Major depressive disorder, single episode, unspecified: Secondary | ICD-10-CM | POA: Diagnosis not present

## 2024-01-06 DIAGNOSIS — F649 Gender identity disorder, unspecified: Secondary | ICD-10-CM | POA: Diagnosis not present

## 2024-01-11 DIAGNOSIS — F329 Major depressive disorder, single episode, unspecified: Secondary | ICD-10-CM | POA: Diagnosis not present

## 2024-01-17 ENCOUNTER — Other Ambulatory Visit (HOSPITAL_COMMUNITY): Payer: Self-pay

## 2024-01-18 ENCOUNTER — Other Ambulatory Visit (HOSPITAL_COMMUNITY): Payer: Self-pay

## 2024-01-18 DIAGNOSIS — F329 Major depressive disorder, single episode, unspecified: Secondary | ICD-10-CM | POA: Diagnosis not present

## 2024-01-19 ENCOUNTER — Other Ambulatory Visit (HOSPITAL_COMMUNITY): Payer: Self-pay

## 2024-01-19 ENCOUNTER — Other Ambulatory Visit: Payer: Self-pay

## 2024-01-19 MED ORDER — TESTOSTERONE 25 MG/2.5GM (1%) TD GEL
2.0000 | Freq: Every day | TRANSDERMAL | 4 refills | Status: AC
  Filled 2024-01-19: qty 150, 30d supply, fill #0

## 2024-01-25 ENCOUNTER — Other Ambulatory Visit (HOSPITAL_COMMUNITY): Payer: Self-pay

## 2024-01-25 DIAGNOSIS — F329 Major depressive disorder, single episode, unspecified: Secondary | ICD-10-CM | POA: Diagnosis not present

## 2024-01-25 DIAGNOSIS — F649 Gender identity disorder, unspecified: Secondary | ICD-10-CM | POA: Diagnosis not present

## 2024-01-25 MED ORDER — TESTOSTERONE 25 MG/2.5GM (1%) TD GEL
TRANSDERMAL | 1 refills | Status: AC
  Filled 2024-01-25: qty 150, 30d supply, fill #0

## 2024-01-27 DIAGNOSIS — R7989 Other specified abnormal findings of blood chemistry: Secondary | ICD-10-CM | POA: Diagnosis not present

## 2024-02-01 DIAGNOSIS — F329 Major depressive disorder, single episode, unspecified: Secondary | ICD-10-CM | POA: Diagnosis not present

## 2024-02-08 DIAGNOSIS — F329 Major depressive disorder, single episode, unspecified: Secondary | ICD-10-CM | POA: Diagnosis not present

## 2024-02-15 DIAGNOSIS — F329 Major depressive disorder, single episode, unspecified: Secondary | ICD-10-CM | POA: Diagnosis not present

## 2024-02-22 DIAGNOSIS — F329 Major depressive disorder, single episode, unspecified: Secondary | ICD-10-CM | POA: Diagnosis not present

## 2024-03-03 ENCOUNTER — Other Ambulatory Visit (HOSPITAL_COMMUNITY): Payer: Self-pay

## 2024-03-03 MED ORDER — TESTOSTERONE 25 MG/2.5GM (1%) TD GEL
50.0000 mg | Freq: Every day | TRANSDERMAL | 1 refills | Status: AC
  Filled 2024-03-03: qty 60, 30d supply, fill #0
  Filled 2024-03-05: qty 150, 30d supply, fill #0

## 2024-03-05 ENCOUNTER — Other Ambulatory Visit: Payer: Self-pay

## 2024-03-05 ENCOUNTER — Other Ambulatory Visit (HOSPITAL_COMMUNITY): Payer: Self-pay

## 2024-03-06 ENCOUNTER — Other Ambulatory Visit (HOSPITAL_COMMUNITY): Payer: Self-pay

## 2024-03-07 DIAGNOSIS — F329 Major depressive disorder, single episode, unspecified: Secondary | ICD-10-CM | POA: Diagnosis not present

## 2024-03-14 DIAGNOSIS — F329 Major depressive disorder, single episode, unspecified: Secondary | ICD-10-CM | POA: Diagnosis not present

## 2024-03-21 DIAGNOSIS — F329 Major depressive disorder, single episode, unspecified: Secondary | ICD-10-CM | POA: Diagnosis not present

## 2024-03-28 DIAGNOSIS — F329 Major depressive disorder, single episode, unspecified: Secondary | ICD-10-CM | POA: Diagnosis not present

## 2024-03-30 DIAGNOSIS — F649 Gender identity disorder, unspecified: Secondary | ICD-10-CM | POA: Diagnosis not present

## 2024-04-05 DIAGNOSIS — F329 Major depressive disorder, single episode, unspecified: Secondary | ICD-10-CM | POA: Diagnosis not present

## 2024-04-11 DIAGNOSIS — F329 Major depressive disorder, single episode, unspecified: Secondary | ICD-10-CM | POA: Diagnosis not present

## 2024-04-19 DIAGNOSIS — F329 Major depressive disorder, single episode, unspecified: Secondary | ICD-10-CM | POA: Diagnosis not present

## 2024-04-25 DIAGNOSIS — F329 Major depressive disorder, single episode, unspecified: Secondary | ICD-10-CM | POA: Diagnosis not present

## 2024-05-09 DIAGNOSIS — F329 Major depressive disorder, single episode, unspecified: Secondary | ICD-10-CM | POA: Diagnosis not present

## 2024-05-30 DIAGNOSIS — F329 Major depressive disorder, single episode, unspecified: Secondary | ICD-10-CM | POA: Diagnosis not present

## 2024-06-06 DIAGNOSIS — F329 Major depressive disorder, single episode, unspecified: Secondary | ICD-10-CM | POA: Diagnosis not present

## 2024-06-07 ENCOUNTER — Ambulatory Visit: Admitting: Internal Medicine

## 2024-06-13 DIAGNOSIS — F329 Major depressive disorder, single episode, unspecified: Secondary | ICD-10-CM | POA: Diagnosis not present

## 2024-06-20 DIAGNOSIS — F329 Major depressive disorder, single episode, unspecified: Secondary | ICD-10-CM | POA: Diagnosis not present

## 2024-06-27 DIAGNOSIS — F329 Major depressive disorder, single episode, unspecified: Secondary | ICD-10-CM | POA: Diagnosis not present

## 2024-07-11 DIAGNOSIS — F329 Major depressive disorder, single episode, unspecified: Secondary | ICD-10-CM | POA: Diagnosis not present

## 2024-07-18 DIAGNOSIS — F329 Major depressive disorder, single episode, unspecified: Secondary | ICD-10-CM | POA: Diagnosis not present

## 2024-08-01 DIAGNOSIS — H52223 Regular astigmatism, bilateral: Secondary | ICD-10-CM | POA: Diagnosis not present

## 2024-08-01 DIAGNOSIS — H5203 Hypermetropia, bilateral: Secondary | ICD-10-CM | POA: Diagnosis not present

## 2024-08-08 DIAGNOSIS — F329 Major depressive disorder, single episode, unspecified: Secondary | ICD-10-CM | POA: Diagnosis not present

## 2024-08-15 DIAGNOSIS — F329 Major depressive disorder, single episode, unspecified: Secondary | ICD-10-CM | POA: Diagnosis not present

## 2024-08-17 MED ORDER — TESTOSTERONE 25 MG/2.5GM (1%) TD GEL
2.0000 | Freq: Every day | TRANSDERMAL | 1 refills | Status: AC
  Filled 2024-08-17: qty 150, 30d supply, fill #0

## 2024-08-20 ENCOUNTER — Other Ambulatory Visit (HOSPITAL_COMMUNITY): Payer: Self-pay
